# Patient Record
Sex: Female | Born: 2000 | Race: Black or African American | Hispanic: No | Marital: Single | Smoking: Never smoker
Health system: Southern US, Community
[De-identification: ages and names within clinical notes are randomized; demographics above are authoritative.]

## PROBLEM LIST (undated history)

## (undated) ENCOUNTER — Emergency Department (HOSPITAL_COMMUNITY): Payer: No Typology Code available for payment source

## (undated) DIAGNOSIS — R55 Syncope and collapse: Secondary | ICD-10-CM

## (undated) DIAGNOSIS — E559 Vitamin D deficiency, unspecified: Secondary | ICD-10-CM

## (undated) DIAGNOSIS — G43909 Migraine, unspecified, not intractable, without status migrainosus: Secondary | ICD-10-CM

## (undated) DIAGNOSIS — S83249A Other tear of medial meniscus, current injury, unspecified knee, initial encounter: Secondary | ICD-10-CM

## (undated) DIAGNOSIS — S83519A Sprain of anterior cruciate ligament of unspecified knee, initial encounter: Secondary | ICD-10-CM

## (undated) HISTORY — DX: Vitamin D deficiency, unspecified: E55.9

## (undated) HISTORY — DX: Migraine, unspecified, not intractable, without status migrainosus: G43.909

## (undated) HISTORY — DX: Syncope and collapse: R55

---
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
## 2020-01-01 ENCOUNTER — Telehealth: Payer: Self-pay | Admitting: Nurse Practitioner

## 2020-01-01 NOTE — Telephone Encounter (Signed)
Called to Discuss with patient about Covid symptoms and the use of bamlanivimab, a monoclonal antibody infusion for those with mild to moderate Covid symptoms and at a high risk of hospitalization.     Pt states that her only symptom is fatigue.

## 2020-01-05 ENCOUNTER — Ambulatory Visit: Payer: 59 | Admitting: Dietician

## 2020-02-12 ENCOUNTER — Other Ambulatory Visit: Payer: Self-pay

## 2020-02-12 ENCOUNTER — Encounter: Payer: Self-pay | Admitting: Dietician

## 2020-02-12 ENCOUNTER — Encounter: Payer: 59 | Attending: Internal Medicine | Admitting: Dietician

## 2020-02-12 DIAGNOSIS — R7303 Prediabetes: Secondary | ICD-10-CM | POA: Insufficient documentation

## 2020-02-12 NOTE — Patient Instructions (Addendum)
Stay active most every day (walk, you tube, dancing). Eat more frequently.  Breakfast, lunch, dinner. Whole grains most of the time. Plenty of Non starchy vegetables Avoid skipping meals.  Aim for Breakfast, Lunch, and Dinner. Listen to your body.  Stop eating when satisfied. Choose lean means, small portions.  Aim for 30-45 grams of carbohydrate per meal Small amounts of protein with meals.

## 2020-02-12 NOTE — Progress Notes (Signed)
Medical Nutrition Therapy:  Appt start time: 1600 end time:  1700.   Assessment:  Primary concerns today: . Patient is here today with her grandmother.  Her mother wants to be on the visit by phone but they are unable to reach her.  She is being seen today for prediabetes and weight. She does have a thyroid nodule. She reports no period despite being on consistent hormone pills for 3 months. Darkening on the skin of her neck and patient noted that this is on other parts of her ody as well.  She reports being diagnosed with PCOS in the past. She was last seen by a RD in 2016.  Her weight was 170 lbs at that time. She is taking Metformin XR  Weight today 218 lbs decreased from 223 lbs at her MD appointment in December.   She currently often only eats 1 meal per day and that is very late (9-10 pm after she gets off of work).  Cytogenetic analysis revealed  20 XX with a partial deletion of long arms of X chromosome. Further genetic work up pending.  PreferreMedications include Metformin XR "should take this more than I should".  Patient lives with her mom, dad, brother and sister who is now in college.  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  Her mom and dad share shopping and cooking.  Romie cooks occasionally.  She works at [employer] Sleeps 5-6 hours per night.  Learning Style:   No preference indicated   Learning Readiness:   Ready  Change in progress   DIETARY INTAKE:  24-hr recall:  B ( AM): skips  Snk ( AM): none L ( PM): skips often OR chicken nuggets or chicken sandwich Snk ( PM): none or salad with ham, chicken, cheese, and corn D (10-11  PM): fried fish and greens OR baked pork chop, brown rice, zucchini Snk ( PM): none Beverages: water, regular soda occasionally, occasional juice, occasional tea, 2% milk occasionally  Usual physical activity: Stands a lot at work and school.  Walks periodically.  Progress Towards Goal(s):  In progress.   Nutritional  Diagnosis:  NB-1.1 Food and nutrition-related knowledge deficit As related to balance of carbohydrates, protein, and fat.  As evidenced by diet hx and patient report.    Intervention:  Nutrition education/counseling regarding the importance of regularly scheduled meals, appropriate balance of carbohydrates, protein, and fat, mindfulness and the benefits of regular exercise.   Discussed that her metabolism may be slowed based on previous eating habits. Discussed that it is best to eat earlier in the day when possible. Stay active most every day (walk, you tube, dancing). Eat more frequently.  Breakfast, lunch, dinner. Whole grains most of the time. Plenty of Non starchy vegetables Avoid skipping meals.  Aim for Breakfast, Lunch, and Dinner. Listen to your body.  Stop eating when satisfied. Choose lean means, small portions.  Aim for 30-45 grams of carbohydrate per meal Small amounts of protein with meals.    Teaching Method Utilized:  Visual Auditory Hands on  Handouts given during visit include:  My plate  Meal plan card  Barriers to learning/adherence to lifestyle change: time, medical dx  Demonstrated degree of understanding via:  Teach Back   Monitoring/Evaluation:  Dietary intake, exercise, and body weight in 2 month(s).

## 2020-03-11 ENCOUNTER — Other Ambulatory Visit: Payer: Self-pay

## 2020-03-15 ENCOUNTER — Other Ambulatory Visit: Payer: Self-pay

## 2020-03-15 ENCOUNTER — Encounter: Payer: Self-pay | Admitting: Internal Medicine

## 2020-03-15 ENCOUNTER — Ambulatory Visit (INDEPENDENT_AMBULATORY_CARE_PROVIDER_SITE_OTHER): Payer: 59 | Admitting: Internal Medicine

## 2020-03-15 VITALS — BP 122/78 | HR 95 | Temp 98.2°F | Ht 61.0 in | Wt 214.0 lb

## 2020-03-15 DIAGNOSIS — R7303 Prediabetes: Secondary | ICD-10-CM | POA: Diagnosis not present

## 2020-03-15 DIAGNOSIS — Q991 46, XX true hermaphrodite: Secondary | ICD-10-CM

## 2020-03-15 DIAGNOSIS — E042 Nontoxic multinodular goiter: Secondary | ICD-10-CM | POA: Diagnosis not present

## 2020-03-15 LAB — LIPID PANEL
Cholesterol: 176 mg/dL (ref 0–200)
HDL: 48.8 mg/dL (ref >39.00)
LDL Cholesterol: 117 mg/dL — ABNORMAL HIGH (ref 0–99)
NonHDL: 127.14
Total CHOL/HDL Ratio: 4
Triglycerides: 51 mg/dL (ref 0.0–149.0)
VLDL: 10.2 mg/dL (ref 0.0–40.0)

## 2020-03-15 LAB — COMPREHENSIVE METABOLIC PANEL
ALT: 36 U/L — ABNORMAL HIGH (ref 0–35)
AST: 19 U/L (ref 0–37)
Albumin: 3.9 g/dL (ref 3.5–5.2)
Alkaline Phosphatase: 64 U/L (ref 47–119)
BUN: 9 mg/dL (ref 6–23)
CO2: 26 mEq/L (ref 19–32)
Calcium: 9 mg/dL (ref 8.4–10.5)
Chloride: 104 mEq/L (ref 96–112)
Creatinine, Ser: 0.73 mg/dL (ref 0.40–1.20)
GFR: 124.26 mL/min (ref >60.00)
Glucose, Bld: 106 mg/dL — ABNORMAL HIGH (ref 70–99)
Potassium: 4 mEq/L (ref 3.5–5.1)
Sodium: 139 mEq/L (ref 135–145)
Total Bilirubin: 0.2 mg/dL (ref 0.2–1.2)
Total Protein: 6.7 g/dL (ref 6.0–8.3)

## 2020-03-15 LAB — POCT GLYCOSYLATED HEMOGLOBIN (HGB A1C): Hemoglobin A1C: 5.6 % (ref 4.0–5.6)

## 2020-03-15 MED ORDER — METFORMIN HCL ER 500 MG PO TB24: 1000.0000 mg | ORAL_TABLET | Freq: Every day | ORAL | 3 refills | Status: DC

## 2020-03-15 MED ORDER — LEVONORGEST-ETH ESTRAD 91-DAY 0.1-0.02 & 0.01 MG PO TABS: 1.0000 | ORAL_TABLET | Freq: Every day | ORAL | 3 refills | Status: DC

## 2020-03-15 NOTE — Progress Notes (Signed)
Name: Jacqueline Olson  MRN/ DOB: [id number], 2001-03-29    Age/ Sex: 19 y.o., female     PCP: Dene Gentry, MD   Reason for Endocrinology Evaluation: Premature Ovarian Failure     Initial Endocrinology Clinic Visit: 11/17/2019    PATIENT IDENTIFIER: Jacqueline Olson is a 19 y.o., female with a past medical history of Prediabetes, obesity and premature ovarian failure  . She has followed with [facility] since 11/17/2019 for consultative assistance with management of her Premature ovarian failure.   HISTORICAL SUMMARY: The patient was first diagnosed with premature ovarian failure during evaluation of irregular menses in 04/2019 with an [place] of 58 mIU/mL and LH 34.3 mIU/mL  With an estradiol of 11.2 pg/mL. She was started on Combined OCP . 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   Per pt she had menarche at age 52. Her menstruations have been regular until the age of 40 when her periods have become irregular.  She was also started on metformin for pre-diabetes  Graduated from high school,currently in cosmetology school  Father with diabetes.  Maternal aunt with thyroid disease.    She was seen by a pediatric genetic counselor at [facility] in 02/2020. A chromosome microarray has been sent .   No Hx of premature ovarian failure in the family  SUBJECTIVE:    Today (03/15/2020):  Jacqueline Olson is here for a follow up on her diagnosis of premature ovarian failure. She is pending microsome microarray testing.   She has not been exercising occasionally. She has lost a few lbs since her last visit.   Denies any local neck symptoms.  She is not consistent with metformin intake.      ROS:  As per HPI.   HISTORY:  Past Medical History:  Past Medical History:  Diagnosis Date  . ACL tear 02/05/2016   right knee  . Medial meniscus tear 02/05/2016   right knee  . Migraines    Past Surgical History:  Past Surgical  History:  Procedure Laterality Date  . CENTRAL VENOUS CATHETER INSERTION  2001-09-14  . KNEE ARTHROSCOPY WITH ANTERIOR CRUCIATE LIGAMENT (ACL) REPAIR WITH HAMSTRING GRAFT Right 03/02/2016   Procedure: RIGHT KNEE ARTHROSCOPY,PARTIAL MEDIAL MENISECTOMY WITH ANTERIOR CRUCIATE LIGAMENT (ACL) REPAIR WITH HAMSTRING AUTOGRAFT;  Surgeon: Ninetta Lights, MD;  Location: [place];  Service: Orthopedics;  Laterality: Right;  . KNEE ARTHROSCOPY WITH LATERAL MENISECTOMY Right 03/02/2016   Procedure: KNEE ARTHROSCOPY WITH PARTIAL LATERAL MENISECTOMY;  Surgeon: Ninetta Lights, MD;  Location: [place];  Service: Orthopedics;  Laterality: Right;    Social History:  reports that she has never smoked. She has never used smokeless tobacco. She reports that she does not drink alcohol or use drugs. Family History:  Family History  Problem Relation Age of Onset  . Hypertension Mother   . Diabetes Father   . Hypertension Father   . Kidney disease Paternal Grandfather        ESRD/dialysis  . Hypertension Paternal Grandfather   . Diabetes Paternal Grandfather   . Stroke Paternal Grandfather      HOME MEDICATIONS: Allergies as of 03/15/2020   No Known Allergies     Medication List       Accurate as of March 15, 2020 10:20 AM. If you have any questions, ask your nurse or doctor.        ibuprofen 200 MG tablet Commonly known as:  ADVIL Take 800 mg by mouth every 6 (six) hours as needed for moderate pain.   Investigational vitamin D 600 UNITS capsule SWOG S0812 Take 600 Units by mouth daily. Take with food.   Levonorgestrel-Ethinyl Estradiol 0.1-0.02 & 0.01 MG tablet Commonly known as: AMETHIA Take 1 tablet by mouth daily.   metFORMIN 500 MG 24 hr tablet Commonly known as: GLUCOPHAGE-XR Take 1,000 mg by mouth at bedtime.         OBJECTIVE:   PHYSICAL EXAM: VS: BP 122/78 (BP Location: Left Arm, Patient Position: Sitting, Cuff Size: Large)   Pulse 95   Temp  98.2 F (36.8 C)   Ht 5\' 1"  (1.549 m)   Wt 214 lb (97.1 kg)   SpO2 95%   BMI 40.43 kg/m    EXAM: General: Pt appears well and is in NAD  Lungs: Clear with good BS bilat with no rales, rhonchi, or wheezes  Heart: Auscultation: RRR.  Abdomen: Normoactive bowel sounds, soft, nontender, without masses or organomegaly palpable  Extremities:  BL LE: No pretibial edema normal ROM and strength.  Neuro: Cranial nerves: II - XII grossly intact  Motor: Normal strength throughout DTRs: 2+ and symmetric in UE without delay in relaxation phase  Mental Status: Judgment, insight: Intact Orientation: Oriented to time, place, and person Mood and affect: No depression, anxiety, or agitation     DATA REVIEWED:  Results for Jacqueline, Olson (MRN Angelita Ingles) as of 03/15/2020 14:09  Ref. Range 03/15/2020 08:39  Sodium Latest Ref Range: 135 - 145 mEq/L 139  Potassium Latest Ref Range: 3.5 - 5.1 mEq/L 4.0  Chloride Latest Ref Range: 96 - 112 mEq/L 104  CO2 Latest Ref Range: 19 - 32 mEq/L 26  Glucose Latest Ref Range: 70 - 99 mg/dL 05/15/2020 (H)  BUN Latest Ref Range: 6 - 23 mg/dL 9  Creatinine Latest Ref Range: 0.40 - 1.20 mg/dL 235  Calcium Latest Ref Range: 8.4 - 10.5 mg/dL 9.0  Alkaline Phosphatase Latest Ref Range: 47 - 119 U/L 64  Albumin Latest Ref Range: 3.5 - 5.2 g/dL 3.9  AST Latest Ref Range: 0 - 37 U/L 19  ALT Latest Ref Range: 0 - 35 U/L 36 (H)  Total Protein Latest Ref Range: 6.0 - 8.3 g/dL 6.7  Total Bilirubin Latest Ref Range: 0.2 - 1.2 mg/dL 0.2  GFR Latest Ref Range: >60.00 mL/min 124.26  Total CHOL/HDL Ratio Unknown 4  Cholesterol Latest Ref Range: 0 - 200 mg/dL 5.73  HDL Cholesterol Latest Ref Range: >39.00 mg/dL 220  LDL (calc) Latest Ref Range: 0 - 99 mg/dL 25.42 (H)  NonHDL Unknown 127.14  Triglycerides Latest Ref Range: 0.0 - 149.0 mg/dL 706  VLDL Latest Ref Range: 0.0 - 40.0 mg/dL 23.7    ASSESSMENT / PLAN / RECOMMENDATIONS:   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 Deletions of the X long arm have correlated with premature menopause or gonadal dysgenesis.  - In review of the literature long arm deletion of chromosome X has been linked to variable degrees of intellectual disability , renal disorders and even seizure disorders. - She is tolerating COCP's without side effects  In the meantime I agree with continuing Levonorgestrel- ethinyl estradiol tablets daily for hormonal replacement.   Medications :  Continue COCP's- needs a refill today      2. Pre-Diabetes :   - I have praised the  pt on recent weight loss and improved glycemic control - I have encouraged her to work on lifestyle changes with exercise, avoiding sugar-sweetened beverages and eating fruits and vegetables.  - I have advised her to take Metformin 500 mg,  2 tabs in the morning to improve compliance ( instead of 2 in the evening )   3. Multinodular Goiter :  - She is clinically and biochemically euthyroid  - No local neck symptoms - She will be due for a repeat thyroid ultrasound ~ 11/2020   4. Elevated ALT:   - Minimal elevation of ALT. Fatty liver is in the differential. Will encourage weight loss.      F/U in 6 months   Signed electronically by: Lyndle Herrlich, MD  [facility] Endocrinology  [facility] [address] [place] [place], [place] [address] Phone: [phone] FAX: [phone]      CC: Maeola Harman, MD [address] [place] [address] Phone: [phone]  Fax: [phone]   Return to Endocrinology clinic as below: Future Appointments  Date Time Provider Department Center  03/26/2020  3:30 PM Bonnita Levan, RD [facility] NDM  09/20/2020  9:10 AM Elese Rane, Konrad Dolores, MD [facility] None

## 2020-03-15 NOTE — Patient Instructions (Signed)
-   Exercise ( brisk walk) 30 minutes 5 days a week - avoid sugar- sweetened beverages.  - If you have to snack choose vegetables and fresh fruits

## 2020-03-19 ENCOUNTER — Encounter: Payer: Self-pay | Admitting: Internal Medicine

## 2020-03-26 ENCOUNTER — Other Ambulatory Visit: Payer: Self-pay

## 2020-03-26 ENCOUNTER — Encounter: Payer: 59 | Attending: Internal Medicine | Admitting: Dietician

## 2020-03-26 ENCOUNTER — Encounter: Payer: Self-pay | Admitting: Dietician

## 2020-03-26 DIAGNOSIS — R7303 Prediabetes: Secondary | ICD-10-CM | POA: Diagnosis present

## 2020-03-26 DIAGNOSIS — Z6841 Body Mass Index (BMI) 40.0 and over, adult: Secondary | ICD-10-CM

## 2020-03-26 NOTE — Progress Notes (Signed)
  Medical Nutrition Therapy:  Appt start time: 1555 end time:  1615.   Assessment:  Primary concerns today: Patient is here today alone.   She states that she is walking more with work and school"C.  She is parking farther away. Trying to eat breakfast in the morning but is not hungry so finds this hard.  Based on her schedule and meal choices, overall diet is high in fat and low in vegetables. She is eating more regularly. She has however lost 6 lbs in the past 6 weeks. A1C decreased to 5.6% 03/15/20 Weight 212 lbs today decreased from 218 lbs 03/26/2020   She does have a thyroid nodule. She reports no period despite being on consistent hormone pills for 3 months. Darkening on the skin of her neck and patient noted that this is on other parts of her ody as well.  She reports being diagnosed with PCOS in the past. She was last seen by a RD in 2016.  Her weight was 170 lbs at that time. She is taking Metformin XR  Weight today 218 lbs decreased from 223 lbs at her MD appointment in December.   She currently often only eats 1 meal per day and that is very late (9-10 pm after she gets off of work).  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 Further genetic work up pending.  PreferreMedications include Metformin XR "should take this more than I should".  Patient lives with her mom, dad, brother and sister who is now in college.  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  Her mom and dad share shopping and cooking.  [place]aya cooks occasionally.  She works at [employer] Sleeps 5-6 hours per night.   Preferred Learning Style:   No preference indicated   Learning Readiness:   Ready  Change in progress   DIETARY INTAKE:  24-hr recall:  B ( AM): chicken biscuit  OR yogurt OR SKIPS  Snk ( AM): none  L ( PM): fast food (Chicken wrap at [employer] -occasional milkshake OR [employer] nuggets and fries or wrap and occasional  fries) Snk ( PM): chips when hungry D ( PM):  Take out- Rice, bean and spinach bowl, tortilla chips OR  Snk ( PM): none Beverages: water, regular lemonade, gatorade, powerade, sweet tea  Usual physical activity: walking more  Progress Towards Goal(s):  In progress.   Nutritional Diagnosis:  NB-1.1 Food and nutrition-related knowledge deficit As related to balance of carbohydrate, protien, and fat.  As evidenced by diet hx.    Intervention:  Nutrition education continued with focus on healthier food choices when eating out and tips to increase her vegetable intake.  Also discussed decreasing her fat intake.  Plan: Continue to stay active most every day (walk, you tube, dancing). Eat more frequently.  Breakfast, lunch, dinner. Whole grains most of the time. Plenty of Non starchy vegetables Listen to your body.  Stop eating when satisfied. Choose lean means, small portions. When eating out, consider the salad more often Choose baked rather than fried. Aim for 30-45 grams of carbohydrate per meal Small amounts of protein with meals.   Choose beverages without sugar/carbohydrates most of the time  Teaching Method Utilized:  Visual Auditory   Barriers to learning/adherence to lifestyle change: time  Demonstrated degree of understanding via:  Teach Back   Monitoring/Evaluation:  Dietary intake, exercise, label reading, and body weight in 2 month(s).

## 2020-03-26 NOTE — Patient Instructions (Addendum)
Continue to stay active most every day (walk, you tube, dancing). Eat more frequently.  Breakfast, lunch, dinner. Whole grains most of the time. Plenty of Non starchy vegetables Listen to your body.  Stop eating when satisfied. Choose lean means, small portions. When eating out, consider the salad more often Choose baked rather than fried.  Aim for 30-45 grams of carbohydrate per meal Small amounts of protein with meals.   Choose beverages without sugar/carbohydrates most of the time

## 2020-05-31 ENCOUNTER — Ambulatory Visit: Payer: 59 | Admitting: Dietician

## 2020-06-28 ENCOUNTER — Encounter: Payer: 59 | Attending: Internal Medicine | Admitting: Dietician

## 2020-06-28 ENCOUNTER — Other Ambulatory Visit: Payer: Self-pay

## 2020-06-28 DIAGNOSIS — Z6841 Body Mass Index (BMI) 40.0 and over, adult: Secondary | ICD-10-CM | POA: Insufficient documentation

## 2020-06-28 NOTE — Patient Instructions (Signed)
Plan: Continue to stay active most every day (walk, you tube, dancing). Eat more frequently. Breakfast, lunch, dinner. Whole grains most of the time. Plenty of Non starchy vegetables Listen to your body. Stop eating when satisfied. Choose lean means, small portions. When eating out, consider the salad more often Choose baked rather than fried. Aim for 30-45 grams of carbohydrate per meal Small amounts of protein with meals. Choose beverages without sugar/carbohydrates most of the time

## 2020-06-28 NOTE — Progress Notes (Signed)
Medical Nutrition Therapy:  Appt start time: 1200 end time:  1215.   Assessment:  Primary concerns today:  Patient is here today alone.  She was last seen 03/26/2020 .  States that she is trying to eat better. Trying to eat breakfast.  Sometimes only eats once per day. She exercises inconsistently and lacks motivation.  She continues to par farther away to increase her steps.  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  Further genetic workup is pending. Medications includes Metformin XR (inconsistent. Patient lives with her mom, dad, brother, and sister who is now in college.  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  Her mom and dad share shopping and cooking.  Meleena cooks occasionally.  She works at [employer] Sleeps 5-6 hours per night.  Weight hx: 208 lbs 06/28/2020 212 lbs 03/2020 218 lbs 223 lbs 11/2019 170 lbs 2016   Preferred Learning Style:   Auditory  Learning Readiness:   Ready  Change in progress    DIETARY INTAKE:  24-hr recall:  B ( AM): none  Snk ( AM):   L ( PM): chicken, black eyed peas, rice Snk ( PM):  D ( PM):  [employer] Chicken wrap and milk shake Snk ( PM):  Beverages: water, occasional regular soda and juice  Usual physical activity: parking farther away   Progress Towards Goal(s):  In progress.   Nutritional Diagnosis:  NB-1.1 Food and nutrition-related knowledge deficit As related to balance of carbohydrate, protein, and fat.  As evidenced by diet hx and patient report.    Intervention:  Nutrition education continued.  Encouraged her to stay active, improve consistency of her meals, look at meal quality and calorie density as well as choosing lower fat choices most often and beverages without carbohydrates most of the time.  Balance meals with carbs and protein.  Plan: Continue to stay active most every day (walk, you tube, dancing). Eat more frequently. Breakfast, lunch,  dinner. Whole grains most of the time. Plenty of Non starchy vegetables Listen to your body. Stop eating when satisfied. Choose lean means, small portions. When eating out, consider the salad more often Choose baked rather than fried. Aim for 30-45 grams of carbohydrate per meal Small amounts of protein with meals. Choose beverages without sugar/carbohydrates most of the time  Teaching Method Utilized:  Auditory  Barriers to learning/adherence to lifestyle change: time, motivation  Demonstrated degree of understanding via:  Teach Back   Monitoring/Evaluation:  Dietary intake, exercise, and body weight in 2 month(s).

## 2020-08-27 ENCOUNTER — Telehealth: Payer: Self-pay | Admitting: Internal Medicine

## 2020-08-27 NOTE — Telephone Encounter (Signed)
Attempted to call the number below twice and give them MD message. No answer was received. A fax was sent to the number listed below with MD message.

## 2020-08-27 NOTE — Telephone Encounter (Signed)
This is not a test that was done or ordered by you is it?

## 2020-08-27 NOTE — Telephone Encounter (Signed)
[place] with [facility]  ph# [phone] called re: Patient is scheduled for an appointment with [facility] on 09/03/20 and [place] stated they need a microarray report and a karyotype report before 09/03/20. Requests to fax the above reports to fax# [phone].

## 2020-09-01 ENCOUNTER — Encounter: Payer: Self-pay | Admitting: Internal Medicine

## 2020-09-20 ENCOUNTER — Other Ambulatory Visit: Payer: Self-pay

## 2020-09-20 ENCOUNTER — Ambulatory Visit (INDEPENDENT_AMBULATORY_CARE_PROVIDER_SITE_OTHER): Payer: 59 | Admitting: Internal Medicine

## 2020-09-20 VITALS — BP 120/76 | HR 96 | Wt 219.0 lb

## 2020-09-20 DIAGNOSIS — Q991 46, XX true hermaphrodite: Secondary | ICD-10-CM

## 2020-09-20 DIAGNOSIS — E042 Nontoxic multinodular goiter: Secondary | ICD-10-CM | POA: Diagnosis not present

## 2020-09-20 DIAGNOSIS — R7303 Prediabetes: Secondary | ICD-10-CM | POA: Diagnosis not present

## 2020-09-20 LAB — COMPREHENSIVE METABOLIC PANEL
ALT: 13 U/L (ref 0–35)
AST: 14 U/L (ref 0–37)
Albumin: 3.5 g/dL (ref 3.5–5.2)
Alkaline Phosphatase: 53 U/L (ref 47–119)
BUN: 13 mg/dL (ref 6–23)
CO2: 24 mEq/L (ref 19–32)
Calcium: 8.6 mg/dL (ref 8.4–10.5)
Chloride: 103 mEq/L (ref 96–112)
Creatinine, Ser: 0.73 mg/dL (ref 0.40–1.20)
GFR: 118.95 mL/min (ref >60.00)
Glucose, Bld: 189 mg/dL — ABNORMAL HIGH (ref 70–99)
Potassium: 3.8 mEq/L (ref 3.5–5.1)
Sodium: 137 mEq/L (ref 135–145)
Total Bilirubin: 0.2 mg/dL (ref 0.2–1.2)
Total Protein: 6.4 g/dL (ref 6.0–8.3)

## 2020-09-20 LAB — TSH: TSH: 1.68 u[IU]/mL (ref 0.40–5.00)

## 2020-09-20 LAB — HEMOGLOBIN A1C: Hgb A1c MFr Bld: 6.4 % (ref 4.6–6.5)

## 2020-09-20 NOTE — Patient Instructions (Signed)
-   Please check with [facility] about not having a period since April,2021    In the meantime continue birth control pills and 2 tablet of metformin daily

## 2020-09-20 NOTE — Progress Notes (Signed)
Name: Jacqueline Olson  MRN/ DOB: [id number], June 14, 2001    Age/ Sex: 19 y.o., female     PCP: Maeola Harman, MD   Reason for Endocrinology Evaluation: Premature Ovarian Failure     Initial Endocrinology Clinic Visit: 11/17/2019    PATIENT IDENTIFIER: Jacqueline Olson is a 19 y.o., female with a past medical history of Prediabetes, obesity and premature ovarian failure  . She has followed with [facility] since 11/17/2019 for consultative assistance with management of her Premature ovarian failure.   HISTORICAL SUMMARY: The patient was first diagnosed with premature ovarian failure during evaluation of irregular menses in 04/2019 with an FSH of 58 mIU/mL and LH 34.3 mIU/mL  With an estradiol of 11.2 pg/mL. She was started on Combined OCP . 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   Per pt she had menarche at age 53. Her menstruations have been regular until the age of 39 when her periods have become irregular.  She was also started on metformin for pre-diabetes  Graduated from high school,currently in cosmetology school  Father with diabetes.  Maternal aunt with thyroid disease.    She was seen by a pediatric genetic counselor at [facility] in 02/2020.   No Hx of premature ovarian failure in the family  SUBJECTIVE:    Today (09/20/2020):  Jacqueline Olson is here for a follow up on her diagnosis of premature ovarian failure, prediabetes and MNG   She is pending microsome microarray testing.   He weight continues to fluctuate   Denies any local neck symptoms.  She is not consistent with metformin intake.     HISTORY:  Past Medical History:  Past Medical History:  Diagnosis Date  . ACL tear 02/05/2016   right knee  . Medial meniscus tear 02/05/2016   right knee  . Migraines    Past Surgical History:  Past Surgical History:  Procedure Laterality Date  . CENTRAL VENOUS CATHETER INSERTION  Jan 09, 2001  . KNEE  ARTHROSCOPY WITH ANTERIOR CRUCIATE LIGAMENT (ACL) REPAIR WITH HAMSTRING GRAFT Right 03/02/2016   Procedure: RIGHT KNEE ARTHROSCOPY,PARTIAL MEDIAL MENISECTOMY WITH ANTERIOR CRUCIATE LIGAMENT (ACL) REPAIR WITH HAMSTRING AUTOGRAFT;  Surgeon: Loreta Ave, MD;  Location: [facility];  Service: Orthopedics;  Laterality: Right;  . KNEE ARTHROSCOPY WITH LATERAL MENISECTOMY Right 03/02/2016   Procedure: KNEE ARTHROSCOPY WITH PARTIAL LATERAL MENISECTOMY;  Surgeon: Loreta Ave, MD;  Location: [facility];  Service: Orthopedics;  Laterality: Right;    Social History:  reports that she has never smoked. She has never used smokeless tobacco. She reports that she does not drink alcohol and does not use drugs. Family History:  Family History  Problem Relation Age of Onset  . Hypertension Mother   . Diabetes Father   . Hypertension Father   . Kidney disease Paternal Grandfather        ESRD/dialysis  . Hypertension Paternal Grandfather   . Diabetes Paternal Grandfather   . Stroke Paternal Grandfather      HOME MEDICATIONS: Allergies as of 09/20/2020   No Known Allergies     Medication List       Accurate as of September 20, 2020  2:19 PM. If you have any questions, ask your nurse or doctor.        ibuprofen 200 MG tablet Commonly known as: ADVIL Take 800 mg by mouth every 6 (six) hours as needed for moderate pain.   Investigational  vitamin D 600 UNITS capsule SWOG S0812 Take 600 Units by mouth daily. Take with food.   Levonorgestrel-Ethinyl Estradiol 0.1-0.02 & 0.01 MG tablet Commonly known as: AMETHIA Take 1 tablet by mouth daily.   metFORMIN 500 MG 24 hr tablet Commonly known as: GLUCOPHAGE-XR Take 2 tablets (1,000 mg total) by mouth daily with breakfast.         OBJECTIVE:   PHYSICAL EXAM: VS: BP 120/76 (BP Location: Right Arm, Patient Position: Sitting, Cuff Size: Normal)   Pulse 96   Wt 219 lb (99.3 kg)   SpO2 99%   BMI 41.38 kg/m     EXAM: General: Pt appears well and is in NAD  Lungs: Clear with good BS bilat with no rales, rhonchi, or wheezes  Heart: Auscultation: RRR.  Abdomen: Normoactive bowel sounds, soft, nontender, without masses or organomegaly palpable  Extremities:  BL LE: No pretibial edema normal ROM and strength.  Mental Status: Judgment, insight: Intact Orientation: Oriented to time, place, and person Mood and affect: No depression, anxiety, or agitation     DATA REVIEWED:  Results for MELISA, DONOFRIO (MRN [id number]) as of 09/20/2020 14:19  Ref. Range 09/20/2020 09:30  Sodium Latest Ref Range: 135 - 145 mEq/L 137  Potassium Latest Ref Range: 3.5 - 5.1 mEq/L 3.8  Chloride Latest Ref Range: 96 - 112 mEq/L 103  CO2 Latest Ref Range: 19 - 32 mEq/L 24  Glucose Latest Ref Range: 70 - 99 mg/dL 096 (H)  BUN Latest Ref Range: 6 - 23 mg/dL 13  Creatinine Latest Ref Range: 0.40 - 1.20 mg/dL 2.83  Calcium Latest Ref Range: 8.4 - 10.5 mg/dL 8.6  Alkaline Phosphatase Latest Ref Range: 47 - 119 U/L 53  Albumin Latest Ref Range: 3.5 - 5.2 g/dL 3.5  AST Latest Ref Range: 0 - 37 U/L 14  ALT Latest Ref Range: 0 - 35 U/L 13  Total Protein Latest Ref Range: 6.0 - 8.3 g/dL 6.4  Total Bilirubin Latest Ref Range: 0.2 - 1.2 mg/dL 0.2  GFR Latest Ref Range: >60.00 mL/min 118.95  Hemoglobin A1C Latest Ref Range: 4.6 - 6.5 % 6.4  TSH Latest Ref Range: 0.40 - 5.00 uIU/mL 1.68     ASSESSMENT / PLAN / RECOMMENDATIONS:   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 Deletions of the X long arm have correlated with premature menopause or gonadal dysgenesis.  - In review of the literature long arm deletion of chromosome X has been linked to variable degrees of intellectual disability , renal disorders and even seizure disorders. - She is tolerating COC's without side effects  In the meantime I agree with  continuing Levonorgestrel- ethinyl estradiol tablets daily for hormonal replacement.  -She was seen by Genetics specialist through  [facility], it is unclear to me if chromosome microarray has been sent , or what was the outcome of it. -Patient will follow up with gynecology as she has not had any breakthrough bleed since April 2021   Medications :  Continue COC's      2. Pre-Diabetes :   -She continues with imperfect adherence of Metformin. -I have emphasized the importance of lifestyle changes with weight loss and exercise to prevent diabetes. -Repeat A1c today is higher at 6.4% - I have again advised her to take Metformin 500 mg,  2 tabs in the morning to improve compliance ( instead of 2  in the evening )    Medication  Metformin 500 mg , 2 tabs daily     3. Multinodular Goiter :  - She is clinically and biochemically euthyroid  - No local neck symptoms -An order for repeat ultrasound has been placed      F/U in 6 months   Signed electronically by: Lyndle Herrlich, MD  [facility] Endocrinology  [facility] [address] [place] [place], [place] [address] Phone: [phone] FAX: [phone]      CC: Maeola Harman, MD [address] [place] [address] Phone: [phone]  Fax: [phone]   Return to Endocrinology clinic as below: Future Appointments  Date Time Provider Department Center  09/27/2020 10:30 AM Bonnita Levan, RD [facility] NDM  03/21/2021  9:30 AM Sabreen Kitchen, Konrad Dolores, MD [facility] None

## 2020-09-27 ENCOUNTER — Ambulatory Visit: Payer: 59 | Admitting: Dietician

## 2020-09-28 ENCOUNTER — Ambulatory Visit
Admission: RE | Admit: 2020-09-28 | Discharge: 2020-09-28 | Disposition: A | Discharge status: Home / Self Care | Payer: Self-pay | Source: Ambulatory Visit | Attending: Internal Medicine | Admitting: Internal Medicine

## 2020-09-28 DIAGNOSIS — E042 Nontoxic multinodular goiter: Secondary | ICD-10-CM

## 2020-10-04 ENCOUNTER — Encounter: Payer: Medicaid Other | Attending: Internal Medicine | Admitting: Dietician

## 2021-03-15 ENCOUNTER — Other Ambulatory Visit: Payer: Self-pay | Admitting: Internal Medicine

## 2021-03-21 ENCOUNTER — Ambulatory Visit (INDEPENDENT_AMBULATORY_CARE_PROVIDER_SITE_OTHER): Payer: Medicaid Other | Admitting: Internal Medicine

## 2021-03-21 ENCOUNTER — Encounter: Payer: Self-pay | Admitting: Internal Medicine

## 2021-03-21 ENCOUNTER — Other Ambulatory Visit: Payer: Self-pay

## 2021-03-21 VITALS — BP 126/80 | HR 91 | Ht 61.0 in | Wt 223.5 lb

## 2021-03-21 DIAGNOSIS — E042 Nontoxic multinodular goiter: Secondary | ICD-10-CM | POA: Diagnosis not present

## 2021-03-21 DIAGNOSIS — R7303 Prediabetes: Secondary | ICD-10-CM | POA: Diagnosis not present

## 2021-03-21 DIAGNOSIS — Q991 46, XX true hermaphrodite: Secondary | ICD-10-CM | POA: Diagnosis not present

## 2021-03-21 LAB — T4, FREE: Free T4: 0.84 ng/dL (ref 0.60–1.60)

## 2021-03-21 LAB — COMPREHENSIVE METABOLIC PANEL
ALT: 9 U/L (ref 0–35)
AST: 12 U/L (ref 0–37)
Albumin: 3.8 g/dL (ref 3.5–5.2)
Alkaline Phosphatase: 65 U/L (ref 39–117)
BUN: 10 mg/dL (ref 6–23)
CO2: 29 mEq/L (ref 19–32)
Calcium: 9.3 mg/dL (ref 8.4–10.5)
Chloride: 103 mEq/L (ref 96–112)
Creatinine, Ser: 0.75 mg/dL (ref 0.40–1.20)
GFR: 114.93 mL/min (ref >60.00)
Glucose, Bld: 99 mg/dL (ref 70–99)
Potassium: 4.3 mEq/L (ref 3.5–5.1)
Sodium: 140 mEq/L (ref 135–145)
Total Bilirubin: 0.2 mg/dL (ref 0.2–1.2)
Total Protein: 7 g/dL (ref 6.0–8.3)

## 2021-03-21 LAB — LIPID PANEL
Cholesterol: 187 mg/dL (ref 0–200)
HDL: 55.8 mg/dL (ref >39.00)
LDL Cholesterol: 122 mg/dL — ABNORMAL HIGH (ref 0–99)
NonHDL: 131.41
Total CHOL/HDL Ratio: 3
Triglycerides: 48 mg/dL (ref 0.0–149.0)
VLDL: 9.6 mg/dL (ref 0.0–40.0)

## 2021-03-21 LAB — HEMOGLOBIN A1C: Hgb A1c MFr Bld: 6.2 % (ref 4.6–6.5)

## 2021-03-21 LAB — TSH: TSH: 0.93 u[IU]/mL (ref 0.35–5.50)

## 2021-03-21 NOTE — Progress Notes (Signed)
Name: Jacqueline Olson  MRN/ DOB: [id number], 10-Feb-2001    Age/ Sex: 20 y.o., female     PCP: Maeola Harman, MD   Reason for Endocrinology Evaluation: Premature Ovarian Failure     Initial Endocrinology Clinic Visit: 11/17/2019    PATIENT IDENTIFIER: Jacqueline Olson is a 20 y.o., female with a past medical history of Prediabetes, obesity and premature ovarian failure  . She has followed with [facility] since 11/17/2019 for consultative assistance with management of her Premature ovarian failure.   HISTORICAL SUMMARY: The patient was first diagnosed with premature ovarian failure during evaluation of irregular menses in 04/2019 with an FSH of 58 mIU/mL and LH 34.3 mIU/mL  With an estradiol of 11.2 pg/mL. She was started on Combined OCP . 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   Per pt she had menarche at age 56. Her menstruations have been regular until the age of 80 when her periods have become irregular.  She was also started on metformin for pre-diabetes  Graduated from high school,currently in cosmetology school  Father with diabetes.  Maternal aunt with thyroid disease.    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 No evidence of Turner syndrome and it was determined no cardiology referral is needed and this deletion only causes premature ovarian failure .  No Hx of premature ovarian failure in the family  SUBJECTIVE:    Today (03/21/2021):  Jacqueline Olson is here for a follow up on her diagnosis of premature ovarian failure, prediabetes and MNG   Denies nausea or vomiting  Denies any local neck symptoms.  Denies hot flashes  She is not exercising     COC's Metformin 500 mg 2 tabs daily   HISTORY:  Past Medical History:  Past Medical History:  Diagnosis Date  . ACL tear 02/05/2016   right knee  . Medial meniscus tear  02/05/2016   right knee  . Migraines    Past Surgical History:  Past Surgical History:  Procedure Laterality Date  . CENTRAL VENOUS CATHETER INSERTION  2000/12/24  . KNEE ARTHROSCOPY WITH ANTERIOR CRUCIATE LIGAMENT (ACL) REPAIR WITH HAMSTRING GRAFT Right 03/02/2016   Procedure: RIGHT KNEE ARTHROSCOPY,PARTIAL MEDIAL MENISECTOMY WITH ANTERIOR CRUCIATE LIGAMENT (ACL) REPAIR WITH HAMSTRING AUTOGRAFT;  Surgeon: Loreta Ave, MD;  Location: [facility];  Service: Orthopedics;  Laterality: Right;  . KNEE ARTHROSCOPY WITH LATERAL MENISECTOMY Right 03/02/2016   Procedure: KNEE ARTHROSCOPY WITH PARTIAL LATERAL MENISECTOMY;  Surgeon: Loreta Ave, MD;  Location: [facility];  Service: Orthopedics;  Laterality: Right;    Social History:  reports that she has never smoked. She has never used smokeless tobacco. She reports that she does not drink alcohol and does not use drugs. Family History:  Family History  Problem Relation Age of Onset  . Hypertension Mother   . Diabetes Father   . Hypertension Father   . Kidney disease Paternal Grandfather        ESRD/dialysis  . Hypertension Paternal Grandfather   . Diabetes Paternal Grandfather   . Stroke Paternal Grandfather      HOME MEDICATIONS: Allergies as of 03/21/2021   No Known Allergies     Medication List       Accurate as of March 21, 2021  7:27 AM. If you have any questions, ask your nurse or doctor.  ibuprofen 200 MG tablet Commonly known as: ADVIL Take 800 mg by mouth every 6 (six) hours as needed for moderate pain.   Investigational vitamin D 600 UNITS capsule SWOG S0812 Take 600 Units by mouth daily. Take with food.   Levonorgestrel-Ethinyl Estradiol 0.1-0.02 & 0.01 MG tablet Commonly known as: AMETHIA TAKE 1 TABLET BY MOUTH EVERY DAY   metFORMIN 500 MG 24 hr tablet Commonly known as: GLUCOPHAGE-XR Take 2 tablets (1,000 mg total) by mouth daily with breakfast.         OBJECTIVE:    PHYSICAL EXAM: VS: There were no vitals taken for this visit.   EXAM: General: Pt appears well and is in NAD  Lungs: Clear with good BS bilat with no rales, rhonchi, or wheezes  Heart: Auscultation: RRR.  Abdomen: Normoactive bowel sounds, soft, nontender, without masses or organomegaly palpable  Extremities:  BL LE: No pretibial edema normal ROM and strength.  Mental Status: Judgment, insight: Intact Orientation: Oriented to time, place, and person Mood and affect: No depression, anxiety, or agitation     DATA REVIEWED:  Results for Jacqueline Olson, Jacqueline Olson (MRN [id number]) as of 03/22/2021 08:21  Ref. Range 03/21/2021 10:14  Sodium Latest Ref Range: 135 - 145 mEq/L 140  Potassium Latest Ref Range: 3.5 - 5.1 mEq/L 4.3  Chloride Latest Ref Range: 96 - 112 mEq/L 103  CO2 Latest Ref Range: 19 - 32 mEq/L 29  Glucose Latest Ref Range: 70 - 99 mg/dL 99  BUN Latest Ref Range: 6 - 23 mg/dL 10  Creatinine Latest Ref Range: 0.40 - 1.20 mg/dL 3.32  Calcium Latest Ref Range: 8.4 - 10.5 mg/dL 9.3  Alkaline Phosphatase Latest Ref Range: 39 - 117 U/L 65  Albumin Latest Ref Range: 3.5 - 5.2 g/dL 3.8  AST Latest Ref Range: 0 - 37 U/L 12  ALT Latest Ref Range: 0 - 35 U/L 9  Total Protein Latest Ref Range: 6.0 - 8.3 g/dL 7.0  Total Bilirubin Latest Ref Range: 0.2 - 1.2 mg/dL 0.2  GFR Latest Ref Range: >60.00 mL/min 114.93  Total CHOL/HDL Ratio Unknown 3  Cholesterol Latest Ref Range: 0 - 200 mg/dL 951  HDL Cholesterol Latest Ref Range: >39.00 mg/dL 88.41  LDL (calc) Latest Ref Range: 0 - 99 mg/dL 660 (H)  NonHDL Unknown 131.41  Triglycerides Latest Ref Range: 0.0 - 149.0 mg/dL 63.0  VLDL Latest Ref Range: 0.0 - 40.0 mg/dL 9.6  Hemoglobin Z6W Latest Ref Range: 4.6 - 6.5 % 6.2  TSH Latest Ref Range: 0.35 - 5.50 uIU/mL 0.93  T4,Free(Direct) Latest Ref Range: 0.60 - 1.60 ng/dL 1.09    ASSESSMENT / PLAN / RECOMMENDATIONS:   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 Deletions of the X long arm have correlated with premature menopause or gonadal dysgenesis.  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 No evidence of Turner syndrome and it was determined no cardiology referral is needed and this deletion only causes premature ovarian failure .  - She is tolerating COC's without side effects   -Patient to schedule an appointment with Gyn as she has not had  menses since last year    Medications :  Continue COC's      2. Pre-Diabetes :   -She encouraged again to start lifestyle changes with low carb diet, and exercise, she continues to gain weight   -Repeat A1c today is higher at 6.2% - Lipid panel stable    Medication  Metformin 500 mg , 2 tabs daily     3. Multinodular Goiter :  - She is clinically and biochemically euthyroid  - No local neck symptoms - Repeat ultrasound 09/2020 did not show any evidence of thyroid nodules       F/U in 6 months   Signed electronically by: Lyndle Herrlich, MD  [facility] Endocrinology  [facility] [address] [place], [place] [address] Phone: [phone] FAX: [phone]      CC: Maeola Harman, MD [address] [place] [address] Phone: [phone]  Fax: [phone]   Return to Endocrinology clinic as below: Future Appointments  Date Time Provider Department Center  03/21/2021  9:30 AM Bayley Hurn, Konrad Dolores, MD [id number] None

## 2021-04-19 ENCOUNTER — Other Ambulatory Visit: Payer: Self-pay | Admitting: Internal Medicine

## 2021-05-11 ENCOUNTER — Telehealth: Payer: Self-pay | Admitting: Hematology and Oncology

## 2021-05-11 NOTE — Telephone Encounter (Signed)
Received a new hem referral from Valora Piccolo, [place] for thrombocytosis. Jacqueline Olson returned my cal and has been scheduled to see Dr. Al Pimple on 6/2 at 1040am. Pt aware to arrive 20 minutes early.

## 2021-05-12 ENCOUNTER — Encounter: Payer: Self-pay | Admitting: Hematology and Oncology

## 2021-05-12 ENCOUNTER — Inpatient Hospital Stay: Payer: No Typology Code available for payment source

## 2021-05-12 ENCOUNTER — Inpatient Hospital Stay
Payer: No Typology Code available for payment source | Attending: Hematology and Oncology | Admitting: Hematology and Oncology

## 2021-05-12 ENCOUNTER — Other Ambulatory Visit: Payer: Self-pay

## 2021-05-12 VITALS — BP 137/75 | HR 85 | Temp 97.9°F | Resp 17 | Ht 61.0 in | Wt 228.8 lb

## 2021-05-12 DIAGNOSIS — D75839 Thrombocytosis, unspecified: Secondary | ICD-10-CM

## 2021-05-12 LAB — COMPREHENSIVE METABOLIC PANEL
ALT: 7 U/L (ref 0–44)
AST: 9 U/L — ABNORMAL LOW (ref 15–41)
Albumin: 3.4 g/dL — ABNORMAL LOW (ref 3.5–5.0)
Alkaline Phosphatase: 67 U/L (ref 38–126)
Anion gap: 11 (ref 5–15)
BUN: 10 mg/dL (ref 6–20)
CO2: 24 mmol/L (ref 22–32)
Calcium: 9.1 mg/dL (ref 8.9–10.3)
Chloride: 106 mmol/L (ref 98–111)
Creatinine, Ser: 0.72 mg/dL (ref 0.44–1.00)
GFR, Estimated: >60 mL/min (ref >60)
Glucose, Bld: 89 mg/dL (ref 70–99)
Potassium: 4.2 mmol/L (ref 3.5–5.1)
Sodium: 141 mmol/L (ref 135–145)
Total Bilirubin: 0.3 mg/dL (ref 0.3–1.2)
Total Protein: 6.9 g/dL (ref 6.5–8.1)

## 2021-05-12 LAB — CBC WITH DIFFERENTIAL/PLATELET
Abs Immature Granulocytes: 0.02 10*3/uL (ref 0.00–0.07)
Basophils Absolute: 0 10*3/uL (ref 0.0–0.1)
Basophils Relative: 1 %
Eosinophils Absolute: 0 10*3/uL (ref 0.0–0.5)
Eosinophils Relative: 0 %
HCT: 35.8 % — ABNORMAL LOW (ref 36.0–46.0)
Hemoglobin: 12.1 g/dL (ref 12.0–15.0)
Immature Granulocytes: 0 %
Lymphocytes Relative: 20 %
Lymphs Abs: 1.4 10*3/uL (ref 0.7–4.0)
MCH: 28.3 pg (ref 26.0–34.0)
MCHC: 33.8 g/dL (ref 30.0–36.0)
MCV: 83.6 fL (ref 80.0–100.0)
Monocytes Absolute: 0.4 10*3/uL (ref 0.1–1.0)
Monocytes Relative: 5 %
Neutro Abs: 5.3 10*3/uL (ref 1.7–7.7)
Neutrophils Relative %: 74 %
Platelets: 462 10*3/uL — ABNORMAL HIGH (ref 150–400)
RBC: 4.28 MIL/uL (ref 3.87–5.11)
RDW: 13.5 % (ref 11.5–15.5)
WBC: 7.2 10*3/uL (ref 4.0–10.5)
nRBC: 0 % (ref 0.0–0.2)

## 2021-05-12 LAB — C-REACTIVE PROTEIN: CRP: 3.2 mg/dL — ABNORMAL HIGH (ref <1.0)

## 2021-05-12 NOTE — Progress Notes (Signed)
[facility] CONSULT NOTE  Patient Care Team: Maeola Harman, MD as PCP - General (Pediatrics)  CHIEF COMPLAINTS/PURPOSE OF CONSULTATION:  Thrombocytosis  ASSESSMENT & PLAN:  This is a pleasant 20 year old female patient with past medical history significant for prediabetes, premature ovarian failure, obesity referred to hematology for evaluation of persistent thrombocytosis.  She denied any complaints pertinent to the thrombocytosis such as known iron deficiency anemia, chronic pain, infections.  Physical examination, obesity, otherwise unremarkable.  I could not feel a definite of splenomegaly but hard to exclude mild splenomegaly with her body habitus. I have reviewed labs today with her.  No evidence of iron deficiency anemia, mild but persistent thrombocytosis noted without any evidence of basophilia or leukocytosis.  We have discussed the following details about primary and secondary thrombocytosis  Differential diagnosis  1. Primary thrombocytosis: Related to myeloproliferative disorders of the bone marrow especially essential thrombocytosis and CML. I would like to send for BCR-ABL as well as JAK-2 mutation analysis. Patient understands that JAK2 mutation is only present in 50% of essential thrombocytosis so the test is advantageous only if it is positive. If it is negative, it does not rule out. 2. Secondary/reactive thrombocytosis Different causes including infections, inflammation, iron deficiency.   Treatment options: 1. If it is primary essential thrombocytosis, treatment would depend on platelet count level as well as history of thrombosis. A. For low risk patients, the treatment would be with aspirin therapy B. for high risk patients, the treatment would be platelet lowering therapy with aspirin 2. Treatment of secondary thrombocytosis would be to treat underlying cause. There would not be any risk of thrombosis with secondary thrombocytosis.  We have discussed  about MPN testing today, mom had questions about insurance auth, I explained to her that I wouldn't know about this and they are welcome to do the labs later after talking to the insurance but they elected to proceed.  Return to clinic in 3 weeks to discuss the results of these tests and to discuss any additional recommendations.  HISTORY OF PRESENTING ILLNESS:  Jacqueline Olson 20 y.o. female is here because of thrombocytosis.  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  Patient tells me that this was first noted about a year ago and since it was persistent she was recommended to see hematology.  She arrived today to the appointment by herself, her mom was present on the phone.  According to patient, she denies any new health complaints.  She is amenorrheic and this was thought to be related to her premature ovarian failure.  She currently continues on birth control.  Besides menstrual issues, she also has vitamin D deficiency and she is yet to start her vitamin D supplementation.  She is also prediabetic with hemoglobin A1c of 6.2 and hence on metformin.  No erythromelalgia, intractable pruritus, B symptoms or other history of DVT/PE.  Rest of the pertinent 10 point ROS reviewed and negative.  REVIEW OF SYSTEMS:   Constitutional: Denies fevers, chills or abnormal night sweats Eyes: Denies blurriness of vision, double vision or watery eyes Ears, nose, mouth, throat, and face: Denies mucositis or sore throat Respiratory: Denies cough, dyspnea or wheezes Cardiovascular: Denies palpitation, chest discomfort or lower extremity swelling Gastrointestinal:  Denies nausea, heartburn or change in bowel habits Skin: Denies abnormal skin rashes Lymphatics: Denies new lymphadenopathy or easy bruising Neurological:Denies  numbness, tingling or new weaknesses Behavioral/Psych: Mood is stable, no new changes  All other systems were reviewed with the patient and are negative.  MEDICAL HISTORY:  Past Medical History:  Diagnosis Date  . ACL tear 02/05/2016   right knee  . Medial meniscus tear 02/05/2016   right knee  . Migraines     SURGICAL HISTORY: Past Surgical History:  Procedure Laterality Date  . CENTRAL VENOUS CATHETER INSERTION  22-Jul-2001  . KNEE ARTHROSCOPY WITH ANTERIOR CRUCIATE LIGAMENT (ACL) REPAIR WITH HAMSTRING GRAFT Right 03/02/2016   Procedure: RIGHT KNEE ARTHROSCOPY,PARTIAL MEDIAL MENISECTOMY WITH ANTERIOR CRUCIATE LIGAMENT (ACL) REPAIR WITH HAMSTRING AUTOGRAFT;  Surgeon: Loreta Ave, MD;  Location: [facility];  Service: Orthopedics;  Laterality: Right;  . KNEE ARTHROSCOPY WITH LATERAL MENISECTOMY Right 03/02/2016   Procedure: KNEE ARTHROSCOPY WITH PARTIAL LATERAL MENISECTOMY;  Surgeon: Loreta Ave, MD;  Location: [facility];  Service: Orthopedics;  Laterality: Right;    SOCIAL HISTORY: Social History   Socioeconomic History  . Marital status: Single    Spouse name: Not on file  . Number of children: Not on file  . Years of education: Not on file  . Highest education level: Not on file  Occupational History  . Not on file  Tobacco Use  . Smoking status: Never Smoker  . Smokeless tobacco: Never Used  Substance and Sexual Activity  . Alcohol use: No  . Drug use: No  . Sexual activity: Not on file  Other Topics Concern  . Not on file  Social History Narrative  . Not on file   Social Determinants of Health   Financial Resource Strain: Not on file  Food Insecurity: Not on file  Transportation Needs: Not on file  Physical Activity: Not on file  Stress: Not on file  Social Connections: Not on file  Intimate Partner Violence: Not At Risk  . Fear of Current or Ex-Partner: No  . Emotionally Abused: No  . Physically Abused: No  . Sexually  Abused: No    FAMILY HISTORY: Family History  Problem Relation Age of Onset  . Hypertension Mother   . Diabetes Father   . Hypertension Father   . Kidney disease Paternal Grandfather        ESRD/dialysis  . Hypertension Paternal Grandfather   . Diabetes Paternal Grandfather   . Stroke Paternal Grandfather     ALLERGIES:  has No Known Allergies.  MEDICATIONS:  Current Outpatient Medications  Medication Sig Dispense Refill  . cetirizine (ZYRTEC) 10 MG tablet Take 10 mg by mouth daily.    Marland Kitchen ibuprofen (ADVIL) 200 MG tablet Take 800 mg by mouth every 6 (six) hours as needed for moderate pain.    . Investigational vitamin D 600 UNITS capsule SWOG [id number] Take 600 Units by mouth daily. Take with food. (Patient not taking: No sig reported)    . Levonorgestrel-Ethinyl Estradiol (AMETHIA) 0.1-0.02 & 0.01 MG tablet TAKE 1 TABLET BY MOUTH EVERY DAY 91 tablet 0  . metFORMIN (GLUCOPHAGE-XR) 500 MG 24 hr tablet TAKE 2 TABLETS(1000 MG) BY MOUTH DAILY WITH BREAKFAST 180 tablet 3  . Vitamin D, Ergocalciferol, (DRISDOL) 1.25 MG (50000 UNIT) CAPS capsule Take 1 capsule by mouth once a week.     No current facility-administered medications for this visit.    PHYSICAL EXAMINATION: ECOG PERFORMANCE STATUS: 0 - Asymptomatic  Vitals:   05/12/21 1058  BP: 137/75  Pulse: 85  Resp: 17  Temp: 97.9 F (36.6 C)  SpO2: 99%  Filed Weights   05/12/21 1058  Weight: 228 lb 12.8 oz (103.8 kg)    GENERAL:alert, no distress and comfortable, obese SKIN: skin color, texture, turgor are normal, no rashes or significant lesions EYES: normal, conjunctiva are pink and non-injected, sclera clear OROPHARYNX:no exudate, no erythema and lips, buccal mucosa, and tongue normal  NECK: supple, thyroid normal size, non-tender, without nodularity LYMPH:  no palpable lymphadenopathy in the cervical, axillary  LUNGS: clear to auscultation and percussion with normal breathing effort HEART: regular rate & rhythm and no  murmurs and no lower extremity edema ABDOMEN:abdomen soft, non-tender and normal bowel sounds Musculoskeletal:no cyanosis of digits and no clubbing  PSYCH: alert & oriented x 3 with fluent speech NEURO: no focal motor/sensory deficits  LABORATORY DATA:  I have reviewed the data as listed Lab Results  Component Value Date   WBC 8.0 05/16/2019   HGB 12.1 05/16/2019   HCT 37.1 05/16/2019   MCV 82.4 05/16/2019   PLT 440 (H) 05/16/2019     Chemistry      Component Value Date/Time   NA 140 03/21/2021 1014   K 4.3 03/21/2021 1014   CL 103 03/21/2021 1014   CO2 29 03/21/2021 1014   BUN 10 03/21/2021 1014   CREATININE 0.75 03/21/2021 1014   CREATININE 0.63 12/13/2017 1538      Component Value Date/Time   CALCIUM 9.3 03/21/2021 1014   ALKPHOS 65 03/21/2021 1014   AST 12 03/21/2021 1014   ALT 9 03/21/2021 1014   BILITOT 0.2 03/21/2021 1014       RADIOGRAPHIC STUDIES: I have personally reviewed the radiological images as listed and agreed with the findings in the report. No results found.  All questions were answered. The patient knows to call the clinic with any problems, questions or concerns. I spent 45 minutes in the care of this patient including H and P, review of records, counseling and coordination of care.  We have discussed about types of thrombocytosis, pathogenesis of essential thrombocytosis, causes of secondary thrombocytosis and work-up needed and lab recommendations for today and follow-up.     Rachel Moulds, MD 05/12/2021 11:07 AM

## 2021-05-18 LAB — ANTINUCLEAR ANTIBODIES, IFA: ANA Ab, IFA: NEGATIVE

## 2021-05-19 LAB — JAK2 (INCLUDING V617F AND EXON 12), MPL,& CALR W/RFL MPN PANEL (NGS)

## 2021-06-02 ENCOUNTER — Inpatient Hospital Stay (HOSPITAL_BASED_OUTPATIENT_CLINIC_OR_DEPARTMENT_OTHER): Payer: No Typology Code available for payment source | Admitting: Hematology and Oncology

## 2021-06-02 ENCOUNTER — Telehealth: Payer: Self-pay | Admitting: Hematology and Oncology

## 2021-06-02 ENCOUNTER — Other Ambulatory Visit: Payer: Self-pay

## 2021-06-02 ENCOUNTER — Encounter: Payer: Self-pay | Admitting: Hematology and Oncology

## 2021-06-02 VITALS — BP 109/76 | HR 95 | Temp 98.2°F | Resp 18 | Ht 61.0 in | Wt 229.0 lb

## 2021-06-02 DIAGNOSIS — D75839 Thrombocytosis, unspecified: Secondary | ICD-10-CM | POA: Diagnosis not present

## 2021-06-02 NOTE — Progress Notes (Signed)
[facility] CONSULT NOTE  Patient Care Team: Maeola Harman, MD as PCP - General (Pediatrics)  CHIEF COMPLAINTS/PURPOSE OF CONSULTATION:  Thrombocytosis  ASSESSMENT & PLAN:   This is a pleasant 20 year old female patient with past medical history significant for prediabetes, premature ovarian failure, obesity referred to hematology for evaluation of persistent thrombocytosis.   During her initial consultation, given persistent thrombocytosis we considered MPN work-up which was unremarkable.  We have reviewed that she continues to have persistent mild thrombocytosis which is most likely reactive.  There is no evidence of autoimmune diseases or clear evidence of myeloproliferative disorder or iron deficiency.  At this time I do believe this is likely a reactive thrombocytosis.  Since she is clinically asymptomatic, I would recommend monitoring.  She can return to clinic in a few months to review labs and to discuss any additional recommendations.  She was instructed to contact us with any B symptoms or other new concerns.  She expressed understanding of the recommendations and is agreeable.   HISTORY OF PRESENTING ILLNESS:  Jacqueline Olson 20 y.o. female is here because of thrombocytosis.  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    Interval History  Patient is here for follow-up after her initial consultation for thrombocytosis.  Since her last visit, she feels pretty much the same.  No interim infections, hospitalizations.  She has seen another physician and she was prescribed as needed muscle relaxer for her pain.  Rest of the pertinent 10 point ROS reviewed and negative.  MEDICAL HISTORY:  Past Medical History:  Diagnosis Date   ACL tear 02/05/2016   right knee   Medial meniscus tear  02/05/2016   right knee   Migraines     SURGICAL HISTORY: Past Surgical History:  Procedure Laterality Date   CENTRAL VENOUS CATHETER INSERTION  04/04/2001   KNEE ARTHROSCOPY WITH ANTERIOR CRUCIATE LIGAMENT (ACL) REPAIR WITH HAMSTRING GRAFT Right 03/02/2016   Procedure: RIGHT KNEE ARTHROSCOPY,PARTIAL MEDIAL MENISECTOMY WITH ANTERIOR CRUCIATE LIGAMENT (ACL) REPAIR WITH HAMSTRING AUTOGRAFT;  Surgeon: Loreta Ave, MD;  Location: [facility];  Service: Orthopedics;  Laterality: Right;   KNEE ARTHROSCOPY WITH LATERAL MENISECTOMY Right 03/02/2016   Procedure: KNEE ARTHROSCOPY WITH PARTIAL LATERAL MENISECTOMY;  Surgeon: Loreta Ave, MD;  Location:  SUR[facility];  Service: Orthopedics;  Laterality: Right;    SOCIAL HISTORY: Social History   Socioeconomic History   Marital status: Single    Spouse name: Not on file   Number of children: Not on file   Years of education: Not on file   Highest education level: Not on file  Occupational History   Not on file  Tobacco Use   Smoking status: Never   Smokeless tobacco: Never  Substance and Sexual Activity   Alcohol use: No   Drug use: No   Sexual activity: Not on file  Other Topics Concern   Not on file  Social History Narrative   Not on file   Social Determinants of Health   Financial Resource Strain: Not on file  Food Insecurity: Not on file  Transportation Needs: Not on file  Physical Activity: Not on file  Stress: Not on file  Social Connections: Not on file  Intimate Partner Violence: Not At Risk   Fear of Current or Ex-Partner: No   Emotionally Abused: No   Physically Abused:  No   Sexually Abused: No    FAMILY HISTORY: Family History  Problem Relation Age of Onset   Hypertension Mother    Diabetes Father    Hypertension Father    Kidney disease Paternal Grandfather        ESRD/dialysis   Hypertension Paternal Grandfather    Diabetes Paternal Grandfather    Stroke Paternal Grandfather      ALLERGIES:  has No Known Allergies.  MEDICATIONS:  Current Outpatient Medications  Medication Sig Dispense Refill   cetirizine (ZYRTEC) 10 MG tablet Take 10 mg by mouth daily.     ibuprofen (ADVIL) 200 MG tablet Take 800 mg by mouth every 6 (six) hours as needed for moderate pain.     Levonorgestrel-Ethinyl Estradiol (AMETHIA) 0.1-0.02 & 0.01 MG tablet TAKE 1 TABLET BY MOUTH EVERY DAY 91 tablet 0   metFORMIN (GLUCOPHAGE-XR) 500 MG 24 hr tablet TAKE 2 TABLETS(1000 MG) BY MOUTH DAILY WITH BREAKFAST 180 tablet 3   Vitamin D, Ergocalciferol, (DRISDOL) 1.25 MG (50000 UNIT) CAPS capsule Take 1 capsule by mouth once a week.     Investigational vitamin D 600 UNITS capsule SWOG S0812 Take 600 Units by mouth daily. Take with food.     No current facility-administered medications for this visit.    PHYSICAL EXAMINATION: ECOG PERFORMANCE STATUS: 0 - Asymptomatic  Vitals:   06/02/21 0930  BP: 109/76  Pulse: 95  Resp: 18  Temp: 98.2 F (36.8 C)  SpO2: 99%   Filed Weights   06/02/21 0930  Weight: 229 lb (103.9 kg)    PE deferred in lieu of counseling.  LABORATORY DATA:  I have reviewed the data as listed Lab Results  Component Value Date   WBC 7.2 05/12/2021   HGB 12.1 05/12/2021   HCT 35.8 (L) 05/12/2021   MCV 83.6 05/12/2021   PLT 462 (H) 05/12/2021     Chemistry      Component Value Date/Time   NA 141 05/12/2021 1139   K 4.2 05/12/2021 1139   CL 106 05/12/2021 1139   CO2 24 05/12/2021 1139   BUN 10 05/12/2021 1139   CREATININE 0.72 05/12/2021 1139   CREATININE 0.63 12/13/2017 1538      Component Value Date/Time   CALCIUM 9.1 05/12/2021 1139   ALKPHOS 67 05/12/2021 1139   AST 9 (L) 05/12/2021 1139   ALT 7 05/12/2021 1139   BILITOT 0.3 05/12/2021 1139       RADIOGRAPHIC STUDIES: I have personally reviewed the radiological images as listed and agreed with the findings in the report. No results found.  All questions were answered. The patient knows to call  the clinic with any problems, questions or concerns. I spent 20 minutes in the care of this patient including H and P, review of records, counseling and coordination of care.  We have reviewed labs from last visit.  CBC shows persistent mild thrombocytosis.  No leukocytosis, basophilia or polycythemia.  No concerns on comprehensive metabolic panel.  JAK2 mutation analysis and rest of MPN work-up was negative.  ANA negative. CRP slightly elevated.     Rachel Moulds, MD 06/02/2021 9:58 AM

## 2021-06-02 NOTE — Telephone Encounter (Signed)
Scheduled per los. Gave avs and calendar  

## 2021-06-16 ENCOUNTER — Other Ambulatory Visit: Payer: Self-pay | Admitting: Internal Medicine

## 2021-06-29 ENCOUNTER — Encounter (HOSPITAL_COMMUNITY): Payer: Self-pay

## 2021-06-29 ENCOUNTER — Ambulatory Visit (HOSPITAL_COMMUNITY)
Admission: EM | Admit: 2021-06-29 | Discharge: 2021-06-29 | Disposition: A | Discharge status: Home / Self Care | Payer: Medicaid Other | Attending: Internal Medicine | Admitting: Internal Medicine

## 2021-06-29 ENCOUNTER — Other Ambulatory Visit: Payer: Self-pay

## 2021-06-29 DIAGNOSIS — R0789 Other chest pain: Secondary | ICD-10-CM

## 2021-06-29 MED ORDER — IBUPROFEN 600 MG PO TABS: 600.0000 mg | ORAL_TABLET | Freq: Four times a day (QID) | ORAL | 0 refills | Status: AC | PRN

## 2021-06-29 NOTE — ED Triage Notes (Signed)
Pt presents with left side chest pain since waking up this morning.

## 2021-06-29 NOTE — Discharge Instructions (Addendum)
Gentle stretching exercises Gentle range of motion exercises Take medications as prescribed Your EKG is reassuring If symptoms worsen please return to urgent care to be reevaluated

## 2021-06-29 NOTE — ED Provider Notes (Signed)
[facility]    CSN: [id number] Arrival date & time: 06/29/21  1210      History   Chief Complaint Chief Complaint  Patient presents with   Chest Pain    HPI Jacqueline Olson is a 20 y.o. female comes to the urgent care with left-sided chest pain which started this morning.  Patient describes the pain as sharp, currently about 6 out of 10, aggravated by movement with no known relieving factors.  Stretching seems to make it better.  Pain does not radiate to the neck, shoulder or back.  Patient denies any trauma to the chest or falls.  She works in a [employer] and does a lot of lifting and turning.  No cough or sputum production.  No upper respiratory infection symptoms.  No family history of early cardiac death.  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   Prediabetes 01/06/2015   Morbid obesity (HCC) 01/06/2015   Acanthosis nigricans, acquired 01/06/2015   Dyspepsia 01/06/2015   Essential hypertension, benign 01/06/2015   Goiter 01/06/2015    Past Surgical History:  Procedure Laterality Date   CENTRAL VENOUS CATHETER INSERTION  2001-06-30   KNEE ARTHROSCOPY WITH ANTERIOR CRUCIATE LIGAMENT (ACL) REPAIR WITH HAMSTRING GRAFT Right 03/02/2016   Procedure: RIGHT KNEE ARTHROSCOPY,PARTIAL MEDIAL MENISECTOMY WITH ANTERIOR CRUCIATE LIGAMENT (ACL) REPAIR WITH HAMSTRING AUTOGRAFT;  Surgeon: Loreta Ave, MD;  Location: [facility];  Service:  Orthopedics;  Laterality: Right;   KNEE ARTHROSCOPY WITH LATERAL MENISECTOMY Right 03/02/2016   Procedure: KNEE ARTHROSCOPY WITH PARTIAL LATERAL MENISECTOMY;  Surgeon: Loreta Ave, MD;  Location: [facility];  Service: Orthopedics;  Laterality: Right;    OB History   No obstetric history on file.      Home Medications    Prior to Admission medications   Medication Sig Start Date End Date Taking? Authorizing Provider  cetirizine (ZYRTEC) 10 MG tablet Take 10 mg by mouth daily. 04/19/21   [provider]  ibuprofen (ADVIL) 600 MG tablet Take 1 tablet (600 mg total) by mouth every 6 (six) hours as needed for moderate pain. 06/29/21   Merrilee Jansky, MD  Investigational vitamin D 600 UNITS capsule SWOG [id number] Take 600 Units by mouth daily. Take with food.    [provider]  Levonorgestrel-Ethinyl Estradiol (AMETHIA) 0.1-0.02 & 0.01 MG tablet TAKE 1 TABLET BY MOUTH EVERY DAY 06/20/21   Shamleffer, Konrad Dolores, MD  metFORMIN (GLUCOPHAGE-XR) 500 MG 24 hr tablet TAKE 2 TABLETS(1000 MG) BY MOUTH DAILY WITH BREAKFAST 04/19/21   Shamleffer, Konrad Dolores, MD  Vitamin D, Ergocalciferol, (DRISDOL) 1.25 MG (50000 UNIT) CAPS capsule Take 1 capsule by mouth once a week. 05/05/21   [provider]    Family History Family History  Problem Relation Age of Onset   Hypertension Mother    Diabetes Father    Hypertension Father    Kidney disease Paternal Grandfather  ESRD/dialysis   Hypertension Paternal Grandfather    Diabetes Paternal Grandfather    Stroke Paternal Grandfather     Social History Social History   Tobacco Use   Smoking status: Never   Smokeless tobacco: Never  Substance Use Topics   Alcohol use: No   Drug use: No     Allergies   Patient has no known allergies.   Review of Systems Review of Systems  Constitutional: Negative.   Respiratory:  Negative for chest tightness.   Cardiovascular:  Positive for chest pain.  Negative for palpitations.  Gastrointestinal: Negative.   Neurological: Negative.     Physical Exam Triage Vital Signs ED Triage Vitals  Enc Vitals Group     BP 06/29/21 1303 121/79     Pulse Rate 06/29/21 1303 (!) 101     Resp 06/29/21 1303 18     Temp 06/29/21 1303 98.7 F (37.1 C)     Temp Source 06/29/21 1303 Oral     SpO2 06/29/21 1303 100 %     Weight --      Height --      Head Circumference --      Peak Flow --      Pain Score 06/29/21 1300 5     Pain Loc --      Pain Edu? --      Excl. in GC? --    No data found.  Updated Vital Signs BP 121/79 (BP Location: Right Arm)   Pulse (!) 101   Temp 98.7 F (37.1 C) (Oral)   Resp 18   SpO2 100%   Visual Acuity Right Eye Distance:   Left Eye Distance:   Bilateral Distance:    Right Eye Near:   Left Eye Near:    Bilateral Near:     Physical Exam Vitals and nursing note reviewed.  Constitutional:      General: She is not in acute distress.    Appearance: She is not ill-appearing.  Cardiovascular:     Rate and Rhythm: Normal rate and regular rhythm.     Heart sounds: Normal heart sounds.  Pulmonary:     Effort: Pulmonary effort is normal.  Musculoskeletal:        General: Normal range of motion.  Skin:    General: Skin is warm.  Neurological:     Mental Status: She is alert.     UC Treatments / Results  Labs (all labs ordered are listed, but only abnormal results are displayed) Labs Reviewed - No data to display  EKG   Radiology No results found.  Procedures Procedures (including critical care time)  Medications Ordered in UC Medications - No data to display  Initial Impression / Assessment and Plan / UC Course  I have reviewed the triage vital signs and the nursing notes.  Pertinent labs & imaging results that were available during my care of the patient were reviewed by me and considered in my medical decision making (see chart for details).     1.  Chest wall pain: Ibuprofen 600  mg every 8 hours as needed for pain Gentle range of motion exercises EKG shows normal sinus rhythm with no acute ST or T wave changes. Return precautions given Final Clinical Impressions(s) / UC Diagnoses   Final diagnoses:  Chest wall pain     Discharge Instructions      Gentle stretching exercises Gentle range of motion exercises Take medications as prescribed Your EKG is reassuring If symptoms worsen please  return to urgent care to be reevaluated   ED Prescriptions     Medication Sig Dispense Auth. Provider   ibuprofen (ADVIL) 600 MG tablet Take 1 tablet (600 mg total) by mouth every 6 (six) hours as needed for moderate pain. 30 tablet Stephanee Barcomb, Britta Mccreedy, MD      PDMP not reviewed this encounter.   Merrilee Jansky, MD 06/29/21 [phone]

## 2021-07-12 ENCOUNTER — Encounter (HOSPITAL_BASED_OUTPATIENT_CLINIC_OR_DEPARTMENT_OTHER): Payer: Self-pay

## 2021-07-12 DIAGNOSIS — R0683 Snoring: Secondary | ICD-10-CM

## 2021-07-12 DIAGNOSIS — G4710 Hypersomnia, unspecified: Secondary | ICD-10-CM

## 2021-08-22 ENCOUNTER — Ambulatory Visit (HOSPITAL_BASED_OUTPATIENT_CLINIC_OR_DEPARTMENT_OTHER): Payer: No Typology Code available for payment source | Attending: Sleep Medicine | Admitting: Sleep Medicine

## 2021-08-22 ENCOUNTER — Other Ambulatory Visit: Payer: Self-pay

## 2021-08-22 DIAGNOSIS — R0683 Snoring: Secondary | ICD-10-CM | POA: Insufficient documentation

## 2021-08-22 DIAGNOSIS — G4710 Hypersomnia, unspecified: Secondary | ICD-10-CM

## 2021-08-22 DIAGNOSIS — G4719 Other hypersomnia: Secondary | ICD-10-CM | POA: Diagnosis present

## 2021-09-12 ENCOUNTER — Inpatient Hospital Stay
Payer: No Typology Code available for payment source | Attending: Hematology and Oncology | Admitting: Hematology and Oncology

## 2021-09-12 ENCOUNTER — Other Ambulatory Visit: Payer: Self-pay

## 2021-09-12 ENCOUNTER — Inpatient Hospital Stay: Payer: No Typology Code available for payment source

## 2021-09-12 VITALS — BP 134/100 | HR 82 | Temp 98.4°F | Resp 17 | Ht 61.0 in | Wt 232.0 lb

## 2021-09-12 DIAGNOSIS — D75839 Thrombocytosis, unspecified: Secondary | ICD-10-CM | POA: Insufficient documentation

## 2021-09-12 LAB — COMPREHENSIVE METABOLIC PANEL
ALT: 10 U/L (ref 0–44)
AST: 10 U/L — ABNORMAL LOW (ref 15–41)
Albumin: 3.3 g/dL — ABNORMAL LOW (ref 3.5–5.0)
Alkaline Phosphatase: 67 U/L (ref 38–126)
Anion gap: 9 (ref 5–15)
BUN: 7 mg/dL (ref 6–20)
CO2: 26 mmol/L (ref 22–32)
Calcium: 9.1 mg/dL (ref 8.9–10.3)
Chloride: 106 mmol/L (ref 98–111)
Creatinine, Ser: 0.76 mg/dL (ref 0.44–1.00)
GFR, Estimated: >60 mL/min (ref >60)
Glucose, Bld: 103 mg/dL — ABNORMAL HIGH (ref 70–99)
Potassium: 4.2 mmol/L (ref 3.5–5.1)
Sodium: 141 mmol/L (ref 135–145)
Total Bilirubin: 0.3 mg/dL (ref 0.3–1.2)
Total Protein: 6.6 g/dL (ref 6.5–8.1)

## 2021-09-12 LAB — CBC WITH DIFFERENTIAL/PLATELET
Abs Immature Granulocytes: 0.03 10*3/uL (ref 0.00–0.07)
Basophils Absolute: 0 10*3/uL (ref 0.0–0.1)
Basophils Relative: 0 %
Eosinophils Absolute: 0 10*3/uL (ref 0.0–0.5)
Eosinophils Relative: 1 %
HCT: 36.7 % (ref 36.0–46.0)
Hemoglobin: 12 g/dL (ref 12.0–15.0)
Immature Granulocytes: 1 %
Lymphocytes Relative: 28 %
Lymphs Abs: 1.7 10*3/uL (ref 0.7–4.0)
MCH: 27.6 pg (ref 26.0–34.0)
MCHC: 32.7 g/dL (ref 30.0–36.0)
MCV: 84.6 fL (ref 80.0–100.0)
Monocytes Absolute: 0.3 10*3/uL (ref 0.1–1.0)
Monocytes Relative: 6 %
Neutro Abs: 4 10*3/uL (ref 1.7–7.7)
Neutrophils Relative %: 64 %
Platelets: 456 10*3/uL — ABNORMAL HIGH (ref 150–400)
RBC: 4.34 MIL/uL (ref 3.87–5.11)
RDW: 13.1 % (ref 11.5–15.5)
WBC: 6 10*3/uL (ref 4.0–10.5)
nRBC: 0 % (ref 0.0–0.2)

## 2021-09-12 NOTE — Progress Notes (Signed)
[facility] CONSULT NOTE  Patient Care Team: Maeola Harman, MD as PCP - General (Pediatrics)  CHIEF COMPLAINTS/PURPOSE OF CONSULTATION:  Thrombocytosis  ASSESSMENT & PLAN:   This is a pleasant 20 year old female patient with past medical history significant for prediabetes, premature ovarian failure, obesity referred to hematology for evaluation of persistent thrombocytosis.   During her initial consultation, given persistent thrombocytosis we considered MPN work-up which was unremarkable.   We have reviewed that she continues to have persistent mild thrombocytosis which is likely reactive. CBC from past 2 yrs with no significant change in platelet count. I recommend ongoing monitoring every 6 months or so. Will try to add ferritin to her labs.  She will RTC in 6 months.  HISTORY OF PRESENTING ILLNESS:  Jacqueline Olson 20 y.o. female is here because of thrombocytosis.  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    Interval History  She is here for FU. Since last visit, no complaints. She has headaches from migraine. No other B symptoms. No change in breathing, bowel or urinary habits.  MEDICAL HISTORY:  Past Medical History:  Diagnosis Date   ACL tear 02/05/2016   right knee   Medial meniscus tear 02/05/2016   right knee   Migraines     SURGICAL HISTORY: Past Surgical History:  Procedure Laterality Date   CENTRAL VENOUS CATHETER INSERTION  Jun 11, 2001   KNEE ARTHROSCOPY WITH ANTERIOR CRUCIATE LIGAMENT (ACL) REPAIR WITH HAMSTRING GRAFT Right 03/02/2016   Procedure: RIGHT KNEE ARTHROSCOPY,PARTIAL MEDIAL MENISECTOMY WITH ANTERIOR CRUCIATE LIGAMENT (ACL) REPAIR WITH HAMSTRING AUTOGRAFT;  Surgeon: Loreta Ave, MD;  Location: [facility];  Service: Orthopedics;   Laterality: Right;   KNEE ARTHROSCOPY WITH LATERAL MENISECTOMY Right 03/02/2016   Procedure: KNEE ARTHROSCOPY WITH PARTIAL LATERAL MENISECTOMY;  Surgeon: Loreta Ave, MD;  Location: [facility];  Service: Orthopedics;  Laterality: Right;    SOCIAL HISTORY: Social History   Socioeconomic History   Marital status: Single    Spouse name: Not on file   Number of children: Not on file   Years of education: Not on file   Highest education level: Not on file  Occupational History   Not on file  Tobacco Use   Smoking status: Never   Smokeless tobacco: Never  Substance and Sexual Activity   Alcohol use: No   Drug use: No   Sexual activity: Not on file  Other Topics Concern   Not on file  Social History Narrative   Not on file   Social Determinants of Health   Financial Resource Strain: Not on file  Food Insecurity: Not on file  Transportation Needs: Not on file  Physical Activity: Not on file  Stress: Not on file  Social Connections: Not on file  Intimate Partner Violence: Not At Risk   Fear of Current or Ex-Partner: No   Emotionally Abused: No   Physically Abused: No   Sexually Abused: No    FAMILY HISTORY: Family History  Problem Relation Age of Onset   Hypertension Mother    Diabetes Father    Hypertension Father    Kidney disease Paternal Grandfather        ESRD/dialysis   Hypertension Paternal Grandfather    Diabetes Paternal Grandfather    Stroke Paternal Grandfather     ALLERGIES:  has No Known Allergies.  MEDICATIONS:  Current Outpatient Medications  Medication Sig Dispense Refill   cetirizine (ZYRTEC) 10 MG tablet Take 10 mg by mouth daily.     ibuprofen (ADVIL) 600 MG tablet Take 1 tablet (600 mg total) by mouth every 6 (six) hours as needed for moderate pain. 30 tablet 0   Investigational vitamin D 600 UNITS capsule SWOG S0812 Take 600 Units by mouth daily. Take with food.     Levonorgestrel-Ethinyl Estradiol (AMETHIA) 0.1-0.02 & 0.01  MG tablet TAKE 1 TABLET BY MOUTH EVERY DAY 91 tablet 0   metFORMIN (GLUCOPHAGE-XR) 500 MG 24 hr tablet TAKE 2 TABLETS(1000 MG) BY MOUTH DAILY WITH BREAKFAST 180 tablet 3   Vitamin D, Ergocalciferol, (DRISDOL) 1.25 MG (50000 UNIT) CAPS capsule Take 1 capsule by mouth once a week.     No current facility-administered medications for this visit.    PHYSICAL EXAMINATION: ECOG PERFORMANCE STATUS: 0 - Asymptomatic  Vitals:   09/12/21 0931  BP: (!) 134/100  Pulse: 82  Resp: 17  Temp: 98.4 F (36.9 C)  SpO2: 99%    Filed Weights   09/12/21 0931  Weight: 232 lb (105.2 kg)    Physical Exam Constitutional:      Appearance: Normal appearance. She is obese.  HENT:     Head: Normocephalic and atraumatic.  Cardiovascular:     Rate and Rhythm: Normal rate and regular rhythm.     Pulses: Normal pulses.     Heart sounds: Normal heart sounds.  Pulmonary:     Effort: Pulmonary effort is normal.     Breath sounds: Normal breath sounds.  Abdominal:     General: Abdomen is flat. Bowel sounds are normal.  Musculoskeletal:        General: No swelling or tenderness.     Cervical back: Normal range of motion and neck supple. No rigidity.  Lymphadenopathy:     Cervical: No cervical adenopathy.  Skin:    General: Skin is warm and dry.  Neurological:     General: No focal deficit present.     Mental Status: She is alert.  Psychiatric:        Mood and Affect: Mood normal.      LABORATORY DATA:  I have reviewed the data as listed Lab Results  Component Value Date   WBC 6.0 09/12/2021   HGB 12.0 09/12/2021   HCT 36.7 09/12/2021   MCV 84.6 09/12/2021   PLT 456 (H) 09/12/2021     Chemistry      Component Value Date/Time   NA 141 09/12/2021 0915   K 4.2 09/12/2021 0915   CL 106 09/12/2021 0915   CO2 26 09/12/2021 0915   BUN 7 09/12/2021 0915   CREATININE 0.76 09/12/2021 0915   CREATININE 0.63 12/13/2017 1538      Component Value Date/Time   CALCIUM 9.1 09/12/2021 0915    ALKPHOS 67 09/12/2021 0915   AST 10 (L) 09/12/2021 0915   ALT 10 09/12/2021 0915   BILITOT 0.3 09/12/2021 0915     RADIOGRAPHIC STUDIES: I have personally reviewed the radiological images as listed and agreed with the findings in the report.  No results found.  All questions were answered. The patient knows to call the clinic with any problems, questions or concerns. I spent 20 minutes in the care of this patient including H and P, review of records, counseling and coordination of care.  JAK2 mutation analysis and rest of MPN work-up was negative.  ANA negative. CRP slightly elevated. CBC from today with  stable platelet count Elevated fasting glucose likely from pre diabetes    Rachel Moulds, MD 09/12/2021 11:33 AM

## 2021-09-12 NOTE — Procedures (Signed)
   NAME: [name] DATE OF BIRTH:  [birth year] MEDICAL RECORD NUMBER [id number]  LOCATION: [facility]  PHYSICIAN: Prithvi Kooi D Jane Birkel  DATE OF STUDY: 08/22/2021  SLEEP STUDY TYPE: Nocturnal Polysomnogram               REFERRING PHYSICIAN: Alanda Slim, MD  CLINICAL INFORMATION Jacqueline Olson is a 20 year old Female and was referred to the sleep center for evaluation of OSA (G47.80).   MEDICATIONS Patient self administered medications include: Melatonin. No sleep medicine administered.  SLEEP STUDY TECHNIQUE A multi-channel overnight Polysomnography study was performed. The channels recorded and monitored were central and occipital EEG, electrooculogram (EOG), submentalis EMG (chin), nasal and oral airflow, thoracic and abdominal wall motion, anterior tibialis EMG, snore microphone, electrocardiogram, and a pulse oximetry. SLEEP ARCHITECTURE The study was initiated at 12:57:25 AM and terminated at 7:03:31 AM. The total recorded time was 366.1 minutes. EEG confirmed total sleep time was 304 minutes yielding a sleep efficiency of 83.0%. Sleep onset after lights out was 19.9 minutes with a REM latency of 151.5 minutes. The patient spent 8.7% of the night in stage N1 sleep, 57.6% in stage N2 sleep, 8.9% in stage N3 and 24.8% in REM. Wake after sleep onset (WASO) was 42.2 minutes. The Arousal Index was 8.1/hour. RESPIRATORY PARAMETERS There were a total of 2 respiratory disturbances out of which 1 were apneas (0 obstructive, 0 mixed, 1 central) and 1 hypopneas. The apnea/hypopnea index (AHI) was 0.4 events/hour. The central sleep apnea index was 0.2 events/hour. The REM AHI was 1.6 events/hour and NREM AHI was 0.0 events/hour. The supine AHI was N/A events/hour and the non supine AHI was 0.4 supine during 0.00% of sleep. Respiratory disturbance index was 3.8 events/hour and associated with an oxygen desaturation down to a nadir of 92.0% during sleep. The mean oxygen  saturation during the study was 96.9%.  LEG MOVEMENT DATA The total leg movements were 0 with a resulting leg movement index of 0.0/hr .Associated arousal with leg movement index was 0.0/hr.  CARDIAC DATA The underlying cardiac rhythm was most consistent with sinus rhythm. Mean heart rate during sleep was 74.2 bpm. Additional rhythm abnormalities include None. IMPRESSIONS - No Significant Obstructive Sleep apnea (OSA) DIAGNOSIS - Primary Snoring (R06.83) RECOMMENDATIONS - Recommend a trial of Oral Appliance for snoring.  - Avoid alcohol, sedatives and other CNS depressants that may worsen sleep apnea and disrupt normal sleep architecture. - Sleep hygiene should be reviewed to assess factors that may improve sleep quality. - Weight management and regular exercise should be initiated or continued.   Rohith Fauth D Weyman Bogdon Diplomate, American Board of Internal Medicine  ELECTRONICALLY SIGNED ON:  09/12/2021, 12:57 PM [facility] PH: [phone]   FX: [phone] ACCREDITED BY THE AMERICAN ACADEMY OF SLEEP MEDICINE

## 2021-09-13 ENCOUNTER — Encounter: Payer: Self-pay | Admitting: Hematology and Oncology

## 2021-09-13 ENCOUNTER — Telehealth: Payer: Self-pay | Admitting: Hematology and Oncology

## 2021-09-13 NOTE — Telephone Encounter (Signed)
Per 10/3 sch msg, pt was called and confirmed appt 

## 2021-09-19 NOTE — Progress Notes (Deleted)
Name: Jacqueline Olson  MRN/ DOB: [id number], February 19, 2001    Age/ Sex: 20 y.o., female     PCP: Maeola Harman, MD   Reason for Endocrinology Evaluation: Premature Ovarian Failure     Initial Endocrinology Clinic Visit: 11/17/19    PATIENT IDENTIFIER: Jacqueline Olson is a 20 y.o., female with a past medical history of Prediabetes, obesity and premature ovarian failure  . She has followed with [facility] since 11/17/2019 for consultative assistance with management of her Premature ovarian failure.   HISTORICAL SUMMARY: The patient was first diagnosed with premature ovarian failure during evaluation of irregular menses in 04/2019 with an FSH of 58 mIU/mL and LH 34.3 mIU/mL  With an estradiol of 11.2 pg/mL. She was started on Combined OCP . 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   Per pt she had menarche at age 20. Her menstruations have been regular until the age of 13 when her periods have become irregular.  She was also started on metformin for pre-diabetes  Graduated from high school,currently in cosmetology school  Father with diabetes.  Maternal aunt with thyroid disease.    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 No evidence of Turner syndrome and it was determined no cardiology referral is needed and this deletion only causes premature ovarian failure .  No Hx of premature ovarian failure in the family  SUBJECTIVE:    Today (09/19/2021):  Jacqueline Olson is here for a follow up on her diagnosis of premature ovarian failure, prediabetes and MNG   Denies nausea or vomiting  Denies any local neck symptoms.  Denies hot flashes  She is not exercising     COC's Metformin 500 mg 2 tabs daily   HISTORY:  Past Medical History:  Past Medical History:  Diagnosis Date   ACL tear 02/05/2016   right knee   Medial meniscus tear  02/05/2016   right knee   Migraines    Past Surgical History:  Past Surgical History:  Procedure Laterality Date   CENTRAL VENOUS CATHETER INSERTION  2001/07/06   KNEE ARTHROSCOPY WITH ANTERIOR CRUCIATE LIGAMENT (ACL) REPAIR WITH HAMSTRING GRAFT Right 03/02/2016   Procedure: RIGHT KNEE ARTHROSCOPY,PARTIAL MEDIAL MENISECTOMY WITH ANTERIOR CRUCIATE LIGAMENT (ACL) REPAIR WITH HAMSTRING AUTOGRAFT;  Surgeon: Loreta Ave, MD;  Location: [facility];  Service: Orthopedics;  Laterality: Right;   KNEE ARTHROSCOPY WITH LATERAL MENISECTOMY Right 03/02/2016   Procedure: KNEE ARTHROSCOPY WITH PARTIAL LATERAL MENISECTOMY;  Surgeon: Loreta Ave, MD;  Location: [facility];  Service: Orthopedics;  Laterality: Right;   Social History:  reports that she has never smoked. She has never used smokeless tobacco. She reports that she does not drink alcohol and does not use drugs. Family History:  Family History  Problem Relation Age of Onset   Hypertension Mother    Diabetes Father    Hypertension Father    Kidney disease Paternal Grandfather        ESRD/dialysis   Hypertension Paternal Grandfather    Diabetes Paternal Grandfather    Stroke Paternal Grandfather      HOME MEDICATIONS: Allergies as of 09/21/2021   No Known Allergies      Medication List        Accurate as of September 19, 2021  2:58 PM. If you have any questions, ask your nurse or doctor.  cetirizine 10 MG tablet Commonly known as: ZYRTEC Take 10 mg by mouth daily.   ibuprofen 600 MG tablet Commonly known as: ADVIL Take 1 tablet (600 mg total) by mouth every 6 (six) hours as needed for moderate pain.   Investigational vitamin D 600 UNITS capsule SWOG S0812 Take 600 Units by mouth daily. Take with food.   Levonorgestrel-Ethinyl Estradiol 0.1-0.02 & 0.01 MG tablet Commonly known as: AMETHIA TAKE 1 TABLET BY MOUTH EVERY DAY   metFORMIN 500 MG 24 hr tablet Commonly known as:  GLUCOPHAGE-XR TAKE 2 TABLETS(1000 MG) BY MOUTH DAILY WITH BREAKFAST   Vitamin D (Ergocalciferol) 1.25 MG (50000 UNIT) Caps capsule Commonly known as: DRISDOL Take 1 capsule by mouth once a week.          OBJECTIVE:   PHYSICAL EXAM: VS: There were no vitals taken for this visit.   EXAM: General: Pt appears well and is in NAD  Lungs: Clear with good BS bilat with no rales, rhonchi, or wheezes  Heart: Auscultation: RRR.  Abdomen: Normoactive bowel sounds, soft, nontender, without masses or organomegaly palpable  Extremities:  BL LE: No pretibial edema normal ROM and strength.  Mental Status: Judgment, insight: Intact Orientation: Oriented to time, place, and person Mood and affect: No depression, anxiety, or agitation     DATA REVIEWED:  Results for Jacqueline, Olson (MRN [id number]) as of 03/22/2021 08:21  Ref. Range 03/21/2021 10:14  Sodium Latest Ref Range: 135 - 145 mEq/L 140  Potassium Latest Ref Range: 3.5 - 5.1 mEq/L 4.3  Chloride Latest Ref Range: 96 - 112 mEq/L 103  CO2 Latest Ref Range: 19 - 32 mEq/L 29  Glucose Latest Ref Range: 70 - 99 mg/dL 99  BUN Latest Ref Range: 6 - 23 mg/dL 10  Creatinine Latest Ref Range: 0.40 - 1.20 mg/dL 5.40  Calcium Latest Ref Range: 8.4 - 10.5 mg/dL 9.3  Alkaline Phosphatase Latest Ref Range: 39 - 117 U/L 65  Albumin Latest Ref Range: 3.5 - 5.2 g/dL 3.8  AST Latest Ref Range: 0 - 37 U/L 12  ALT Latest Ref Range: 0 - 35 U/L 9  Total Protein Latest Ref Range: 6.0 - 8.3 g/dL 7.0  Total Bilirubin Latest Ref Range: 0.2 - 1.2 mg/dL 0.2  GFR Latest Ref Range: >60.00 mL/min 114.93  Total CHOL/HDL Ratio Unknown 3  Cholesterol Latest Ref Range: 0 - 200 mg/dL 086  HDL Cholesterol Latest Ref Range: >39.00 mg/dL 76.19  LDL (calc) Latest Ref Range: 0 - 99 mg/dL 509 (H)  NonHDL Unknown 131.41  Triglycerides Latest Ref Range: 0.0 - 149.0 mg/dL 32.6  VLDL Latest Ref Range: 0.0 - 40.0 mg/dL 9.6  Hemoglobin Z1I Latest Ref Range: 4.6 - 6.5 % 6.2   TSH Latest Ref Range: 0.35 - 5.50 uIU/mL 0.93  T4,Free(Direct) Latest Ref Range: 0.60 - 1.60 ng/dL 4.58    ASSESSMENT / PLAN / RECOMMENDATIONS:   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 Deletions of the X long arm have correlated with premature menopause or gonadal dysgenesis.  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 No evidence of Turner syndrome and  it was determined no cardiology referral is needed and this deletion only causes premature ovarian failure .  - She is tolerating COC's without side effects   -Patient to schedule an appointment with Gyn as she has not had menses since last year    Medications :  Continue COC's      2. Pre-Diabetes :   -She encouraged again to start lifestyle changes with low carb diet, and exercise, she continues to gain weight   -Repeat A1c today is higher at 6.2% - Lipid panel stable    Medication  Metformin 500 mg , 2 tabs daily     3. Multinodular Goiter :  - She is clinically and biochemically euthyroid  - No local neck symptoms - Repeat ultrasound 09/2020 did not show any evidence of thyroid nodules       F/U in 6 months   Signed electronically by: Lyndle Herrlich, MD  [facility]  [facility] [address] [place] [place], [place] [address] Phone: [phone] FAX: [phone]      CC: Maeola Harman, MD [address] [place] [address] Phone: [phone]  Fax: [phone]   Return to Endocrinology clinic as below: Future Appointments  Date Time Provider Department Center   09/20/2021  9:45 AM [facility] None  09/21/2021  9:30 AM Jacquelynn Friend, Konrad Dolores, MD [facility] None

## 2021-09-20 ENCOUNTER — Other Ambulatory Visit: Payer: Self-pay

## 2021-09-20 ENCOUNTER — Inpatient Hospital Stay: Payer: No Typology Code available for payment source

## 2021-09-20 DIAGNOSIS — D75839 Thrombocytosis, unspecified: Secondary | ICD-10-CM

## 2021-09-20 LAB — COMPREHENSIVE METABOLIC PANEL
ALT: 10 U/L (ref 0–44)
AST: 12 U/L — ABNORMAL LOW (ref 15–41)
Albumin: 3.4 g/dL — ABNORMAL LOW (ref 3.5–5.0)
Alkaline Phosphatase: 74 U/L (ref 38–126)
Anion gap: 10 (ref 5–15)
BUN: 9 mg/dL (ref 6–20)
CO2: 23 mmol/L (ref 22–32)
Calcium: 9.2 mg/dL (ref 8.9–10.3)
Chloride: 106 mmol/L (ref 98–111)
Creatinine, Ser: 0.73 mg/dL (ref 0.44–1.00)
GFR, Estimated: >60 mL/min (ref >60)
Glucose, Bld: 102 mg/dL — ABNORMAL HIGH (ref 70–99)
Potassium: 4.2 mmol/L (ref 3.5–5.1)
Sodium: 139 mmol/L (ref 135–145)
Total Bilirubin: 0.4 mg/dL (ref 0.3–1.2)
Total Protein: 7 g/dL (ref 6.5–8.1)

## 2021-09-20 LAB — IRON AND TIBC
Iron: 63 ug/dL (ref 41–142)
Saturation Ratios: 17 % — ABNORMAL LOW (ref 21–57)
TIBC: 360 ug/dL (ref 236–444)
UIBC: 297 ug/dL (ref 120–384)

## 2021-09-20 LAB — CBC WITH DIFFERENTIAL/PLATELET
Abs Immature Granulocytes: 0.02 10*3/uL (ref 0.00–0.07)
Basophils Absolute: 0 10*3/uL (ref 0.0–0.1)
Basophils Relative: 0 %
Eosinophils Absolute: 0.1 10*3/uL (ref 0.0–0.5)
Eosinophils Relative: 1 %
HCT: 35.9 % — ABNORMAL LOW (ref 36.0–46.0)
Hemoglobin: 12.3 g/dL (ref 12.0–15.0)
Immature Granulocytes: 0 %
Lymphocytes Relative: 23 %
Lymphs Abs: 1.6 10*3/uL (ref 0.7–4.0)
MCH: 28.1 pg (ref 26.0–34.0)
MCHC: 34.3 g/dL (ref 30.0–36.0)
MCV: 82 fL (ref 80.0–100.0)
Monocytes Absolute: 0.4 10*3/uL (ref 0.1–1.0)
Monocytes Relative: 5 %
Neutro Abs: 5 10*3/uL (ref 1.7–7.7)
Neutrophils Relative %: 71 %
Platelets: 422 10*3/uL — ABNORMAL HIGH (ref 150–400)
RBC: 4.38 MIL/uL (ref 3.87–5.11)
RDW: 13.2 % (ref 11.5–15.5)
WBC: 7.1 10*3/uL (ref 4.0–10.5)
nRBC: 0 % (ref 0.0–0.2)

## 2021-09-20 LAB — HEMOGLOBIN A1C
Hgb A1c MFr Bld: 6.1 % — ABNORMAL HIGH (ref 4.8–5.6)
Mean Plasma Glucose: 128.37 mg/dL

## 2021-09-20 LAB — FERRITIN: Ferritin: 64 ng/mL (ref 11–307)

## 2021-09-21 ENCOUNTER — Ambulatory Visit: Payer: Medicaid Other | Admitting: Internal Medicine

## 2021-09-22 ENCOUNTER — Ambulatory Visit (INDEPENDENT_AMBULATORY_CARE_PROVIDER_SITE_OTHER): Payer: No Typology Code available for payment source | Admitting: Internal Medicine

## 2021-09-22 ENCOUNTER — Encounter: Payer: Self-pay | Admitting: Internal Medicine

## 2021-09-22 ENCOUNTER — Other Ambulatory Visit: Payer: Self-pay

## 2021-09-22 VITALS — BP 116/72 | HR 92 | Ht 61.0 in | Wt 233.0 lb

## 2021-09-22 DIAGNOSIS — E010 Iodine-deficiency related diffuse (endemic) goiter: Secondary | ICD-10-CM | POA: Diagnosis not present

## 2021-09-22 DIAGNOSIS — R7303 Prediabetes: Secondary | ICD-10-CM | POA: Diagnosis not present

## 2021-09-22 DIAGNOSIS — Q991 46, XX true hermaphrodite: Secondary | ICD-10-CM | POA: Diagnosis not present

## 2021-09-22 MED ORDER — METFORMIN HCL ER 500 MG PO TB24
1000.0000 mg | ORAL_TABLET | Freq: Every day | ORAL | 3 refills | Status: DC
Start: 2021-09-22 — End: 2021-11-15

## 2021-09-22 MED ORDER — LEVONORGEST-ETH ESTRAD 91-DAY 0.1-0.02 & 0.01 MG PO TABS
1.0000 | ORAL_TABLET | Freq: Every day | ORAL | 3 refills | Status: DC
Start: 2021-09-22 — End: 2022-03-24

## 2021-09-22 NOTE — Progress Notes (Signed)
Name: Jacqueline Olson  MRN/ DOB: [id number], July 13, 2001    Age/ Sex: 20 y.o., female     PCP: Deatra James, MD   Reason for Endocrinology Evaluation: Premature Ovarian Failure     Initial Endocrinology Clinic Visit: 11/17/2019    PATIENT IDENTIFIER: [name] is a 20 y.o., female with a past medical history of Prediabetes, obesity and premature ovarian failure  . She has followed with [facility] since 11/17/2019 for consultative assistance with management of her Premature ovarian failure.   HISTORICAL SUMMARY: The patient was first diagnosed with premature ovarian failure during evaluation of irregular menses in 04/2019 with an FSH of 58 mIU/mL and LH 34.3 mIU/mL  With an estradiol of 11.2 pg/mL. She was started on Combined OCP . 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   Per pt she had menarche at age 83. Her menstruations have been regular until the age of 36 when her periods have become irregular.  She was also started on metformin for pre-diabetes  Graduated from high school,currently in cosmetology school  Father with diabetes.  Maternal aunt with thyroid disease.    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 No evidence of Turner syndrome and it was determined no cardiology referral is needed and this deletion only causes premature ovarian failure .  No Hx of premature ovarian failure in the family  SUBJECTIVE:    Today (09/22/2021):  Jacqueline Olson is here for a follow up on her diagnosis of premature ovarian failure, prediabetes and MNG  Denies nausea, vomiting and diarrhea  Has occasional chronic headaches , has occasional nasal congestion - tylenol helps at times.  Denies vision changes  Has occasional hot flashes  She is not exercising  Admits to occasional forgetting medication, and dietary  indiscretions    COC's Metformin 500 mg 2 tabs daily   HISTORY:  Past Medical History:  Past Medical History:  Diagnosis Date   ACL tear 02/05/2016   right knee   Medial meniscus tear 02/05/2016   right knee   Migraines    Past Surgical History:  Past Surgical History:  Procedure Laterality Date   CENTRAL VENOUS CATHETER INSERTION  09-18-2001   KNEE ARTHROSCOPY WITH ANTERIOR CRUCIATE LIGAMENT (ACL) REPAIR WITH HAMSTRING GRAFT Right 03/02/2016   Procedure: RIGHT KNEE ARTHROSCOPY,PARTIAL MEDIAL MENISECTOMY WITH ANTERIOR CRUCIATE LIGAMENT (ACL) REPAIR WITH HAMSTRING AUTOGRAFT;  Surgeon: Loreta Ave, MD;  Location: [facility];  Service: Orthopedics;  Laterality: Right;   KNEE ARTHROSCOPY WITH LATERAL MENISECTOMY Right 03/02/2016   Procedure: KNEE ARTHROSCOPY WITH PARTIAL LATERAL MENISECTOMY;  Surgeon: Loreta Ave, MD;  Location: [facility];  Service: Orthopedics;  Laterality: Right;   Social History:  reports that she has never smoked. She has never used smokeless tobacco. She reports that she does not drink alcohol and does not use drugs. Family History:  Family History  Problem Relation Age of Onset   Hypertension Mother    Diabetes Father    Hypertension Father    Kidney disease Paternal Grandfather        ESRD/dialysis   Hypertension Paternal Grandfather    Diabetes Paternal Grandfather    Stroke Paternal Grandfather      HOME MEDICATIONS: Allergies as of 09/22/2021   No Known Allergies      Medication List  Accurate as of September 22, 2021  8:09 AM. If you have any questions, ask your nurse or doctor.          cetirizine 10 MG tablet Commonly known as: ZYRTEC Take 10 mg by mouth daily.   ibuprofen 600 MG tablet Commonly known as: ADVIL Take 1 tablet (600 mg total) by mouth every 6 (six) hours as needed for moderate pain.   Investigational vitamin D 600 UNITS capsule SWOG S0812 Take 600 Units by mouth daily. Take  with food.   Levonorgestrel-Ethinyl Estradiol 0.1-0.02 & 0.01 MG tablet Commonly known as: AMETHIA TAKE 1 TABLET BY MOUTH EVERY DAY   metFORMIN 500 MG 24 hr tablet Commonly known as: GLUCOPHAGE-XR TAKE 2 TABLETS(1000 MG) BY MOUTH DAILY WITH BREAKFAST   Vitamin D (Ergocalciferol) 1.25 MG (50000 UNIT) Caps capsule Commonly known as: DRISDOL Take 1 capsule by mouth once a week.          OBJECTIVE:   PHYSICAL EXAM: VS: BP 116/72 (BP Location: Left Arm, Patient Position: Sitting, Cuff Size: Small)   Pulse 92   Ht 5\' 1"  (1.549 m)   Wt 233 lb (105.7 kg)   SpO2 99%   BMI 44.02 kg/m    EXAM: General: Pt appears well and is in NAD Right thyroid enlargement appreciated, normal left lobe   Lungs: Clear with good BS bilat with no rales, rhonchi, or wheezes  Heart: Auscultation: RRR.  Abdomen: Normoactive bowel sounds, soft, nontender, without masses or organomegaly palpable  Extremities:  BL LE: No pretibial edema normal ROM and strength.  Mental Status: Judgment, insight: Intact Orientation: Oriented to time, place, and person Mood and affect: No depression, anxiety, or agitation     DATA REVIEWED: Results for Jacqueline Olson, Jacqueline Olson (MRN Angelita Ingles) as of 09/22/2021 08:01  Ref. Range 09/20/2021 10:07 09/20/2021 10:11  COMPREHENSIVE METABOLIC PANEL Unknown  Rpt (A)  Sodium Latest Ref Range: 135 - 145 mmol/L  139  Potassium Latest Ref Range: 3.5 - 5.1 mmol/L  4.2  Chloride Latest Ref Range: 98 - 111 mmol/L  106  CO2 Latest Ref Range: 22 - 32 mmol/L  23  Glucose Latest Ref Range: 70 - 99 mg/dL  11/20/2021 (H)  Mean Plasma Glucose Latest Units: mg/dL 326   BUN Latest Ref Range: 6 - 20 mg/dL  9  Creatinine Latest Ref Range: 0.44 - 1.00 mg/dL  712.45  Calcium Latest Ref Range: 8.9 - 10.3 mg/dL  9.2  Anion gap Latest Ref Range: 5 - 15   10  Alkaline Phosphatase Latest Ref Range: 38 - 126 U/L  74  Albumin Latest Ref Range: 3.5 - 5.0 g/dL  3.4 (L)  AST Latest Ref Range: 15 - 41 U/L  12  (L)  ALT Latest Ref Range: 0 - 44 U/L  10  Total Protein Latest Ref Range: 6.5 - 8.1 g/dL  7.0  Total Bilirubin Latest Ref Range: 0.3 - 1.2 mg/dL  0.4  GFR, Estimated Latest Ref Range: >60 mL/min  >60  Iron Latest Ref Range: 41 - 142 ug/dL  63  UIBC Latest Ref Range: 120 - 384 ug/dL  8.09  TIBC Latest Ref Range: 236 - 444 ug/dL  983  Saturation Ratios Latest Ref Range: 21 - 57 %  17 (L)  Ferritin Latest Ref Range: 11 - 307 ng/mL  64  WBC Latest Ref Range: 4.0 - 10.5 K/uL  7.1  RBC Latest Ref Range: 3.87 - 5.11 MIL/uL  4.38  Hemoglobin Latest Ref Range: 12.0 - 15.0  g/dL  00.1  HCT Latest Ref Range: 36.0 - 46.0 %  35.9 (L)  MCV Latest Ref Range: 80.0 - 100.0 fL  82.0  MCH Latest Ref Range: 26.0 - 34.0 pg  28.1  MCHC Latest Ref Range: 30.0 - 36.0 g/dL  74.9  RDW Latest Ref Range: 11.5 - 15.5 %  13.2  Platelets Latest Ref Range: 150 - 400 K/uL  422 (H)  nRBC Latest Ref Range: 0.0 - 0.2 %  0.0  Neutrophils Latest Units: %  71  Lymphocytes Latest Units: %  23  Monocytes Relative Latest Units: %  5  Eosinophil Latest Units: %  1  Basophil Latest Units: %  0  Immature Granulocytes Latest Units: %  0  NEUT# Latest Ref Range: 1.7 - 7.7 K/uL  5.0  Lymphocyte # Latest Ref Range: 0.7 - 4.0 K/uL  1.6  Monocyte # Latest Ref Range: 0.1 - 1.0 K/uL  0.4  Eosinophils Absolute Latest Ref Range: 0.0 - 0.5 K/uL  0.1  Basophils Absolute Latest Ref Range: 0.0 - 0.1 K/uL  0.0  Abs Immature Granulocytes Latest Ref Range: 0.00 - 0.07 K/uL  0.02  Hemoglobin A1C Latest Ref Range: 4.8 - 5.6 % 6.1 (H)      ASSESSMENT / PLAN / RECOMMENDATIONS:   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 Deletions of the X long arm have correlated with premature menopause or gonadal dysgenesis.  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 No evidence of Turner syndrome and it was determined no cardiology referral is needed and this deletion only causes premature ovarian failure .  - She is tolerating COC's without side effects    Medications :  Continue Amethia 0.1-0.02 daily       2. Pre-Diabetes :   -She encouraged again to exercise, avoid sugar-sweetened beverages and low carb diet    - A1c stable at 6.1%   Medication  Metformin 500 mg , 2 tabs daily     3. Multinodular Goiter :  - She is clinically and biochemically euthyroid  - No local neck symptoms, but right thyroid lobe asymmetry noted  - Repeat ultrasound 09/2020 did not show any evidence of thyroid nodules , will consider       F/U in 6 months   Signed electronically by: Lyndle Herrlich, MD  [facility]  [facility] [address] [place], [place] [address] Phone: [phone] FAX: [phone]      CC: Deatra James, MD [address] [place] [address] Phone: [phone]  Fax: [phone]   Return to Endocrinology clinic as below: No future appointments.

## 2021-10-08 IMAGING — US US THYROID
1 series · 13 of 25 positions shown · non-contrast
Comparison: None.

CLINICAL DATA: Thyromegaly

EXAM:
THYROID ULTRASOUND
TECHNIQUE: Ultrasound examination of the thyroid gland and adjacent soft
tissues was performed.

[Series 1: us thyroid · 0.07mm/px · 13 of 46 slices shown]
[im 1/46]
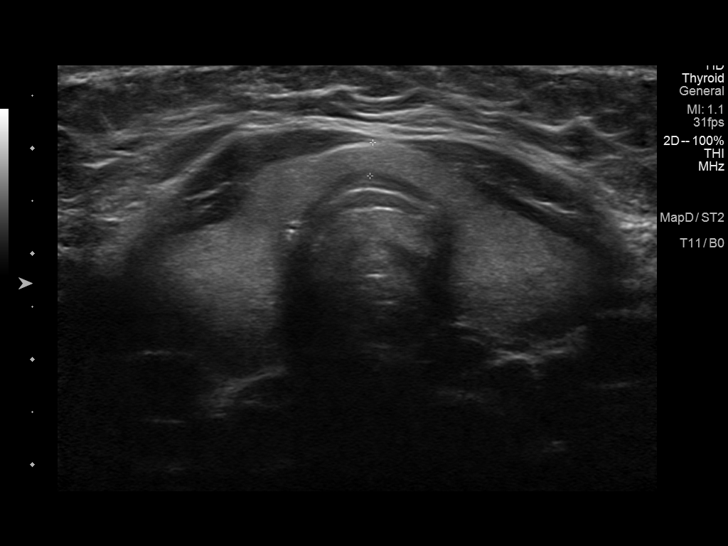
[im 4/46]
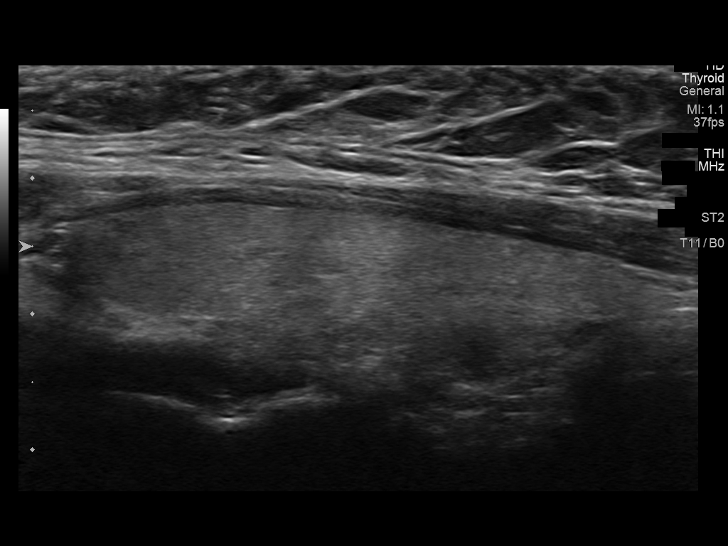
[im 8/46]
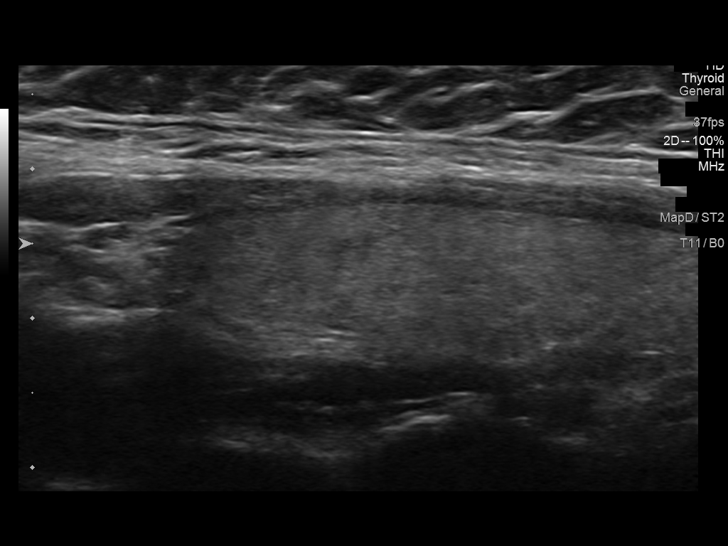
[im 12/46]
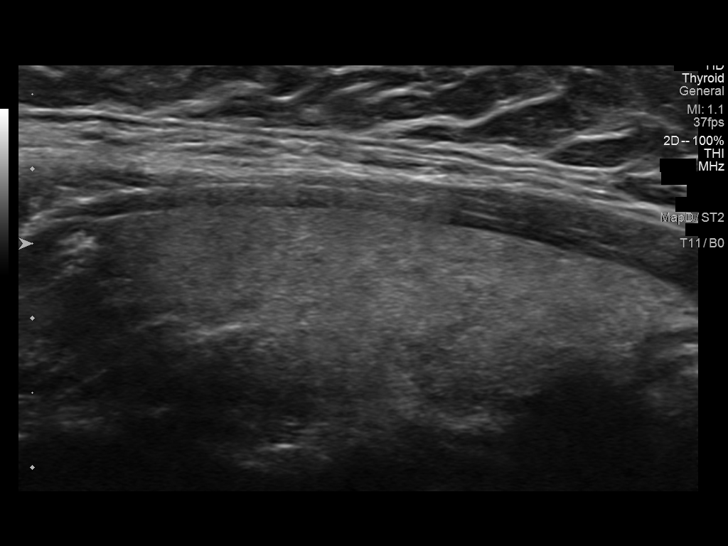
[im 16/46]
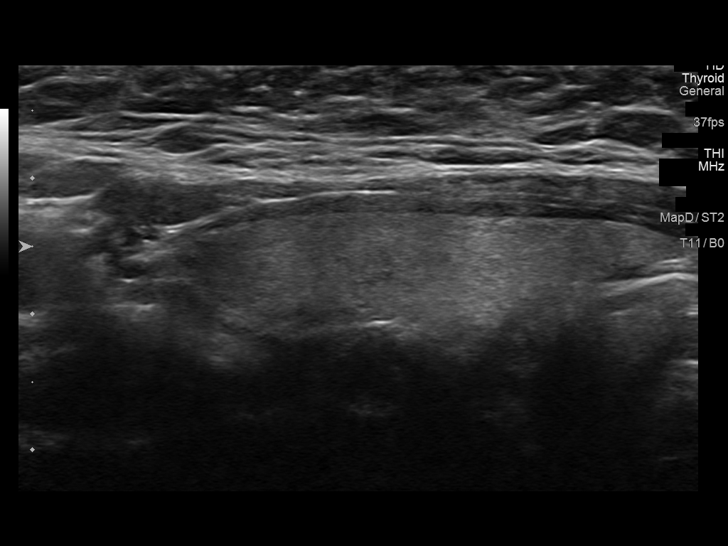
[im 19/46]
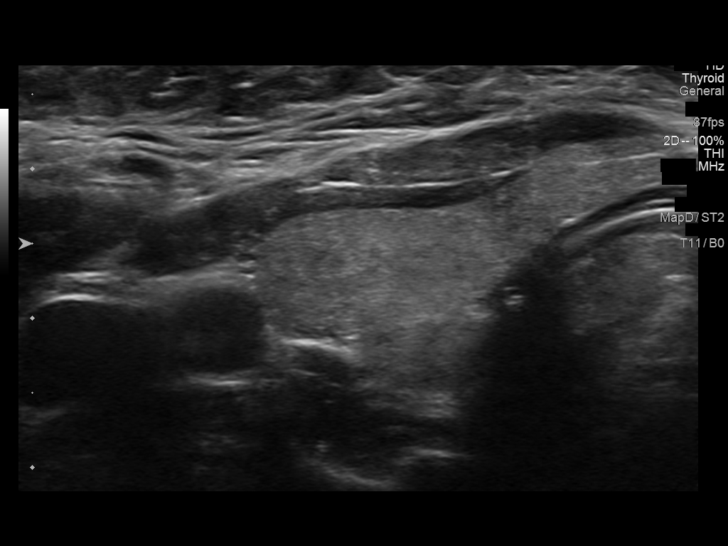
[im 23/46]
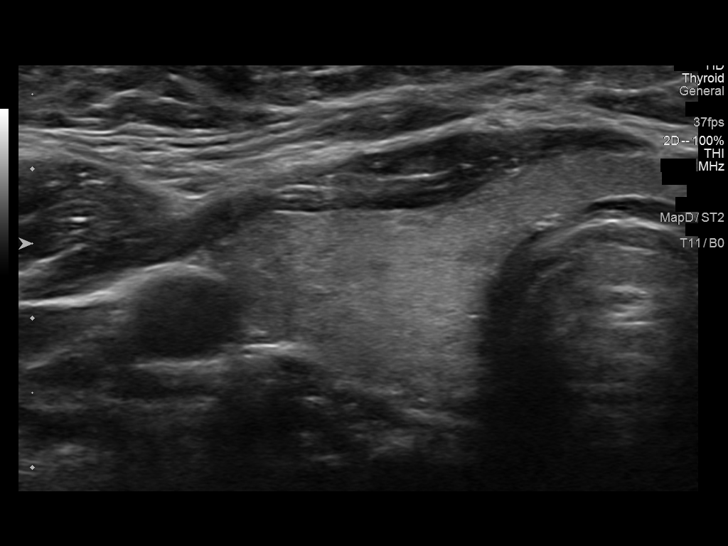
[im 27/46]
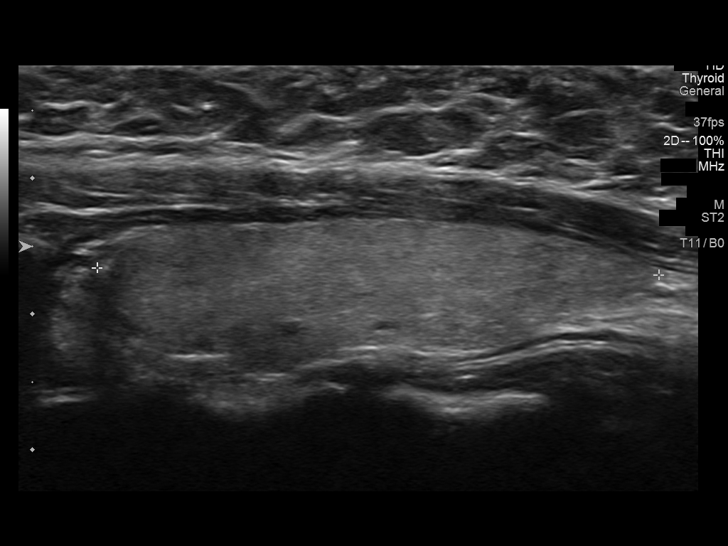
[im 31/46]
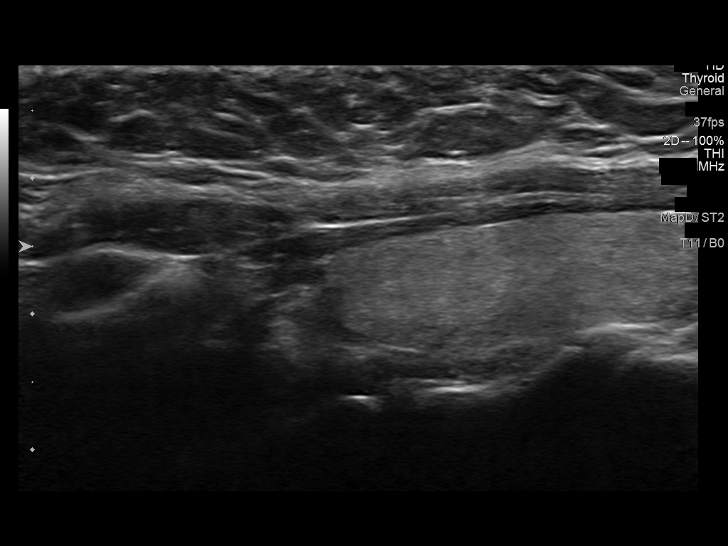
[im 34/46]
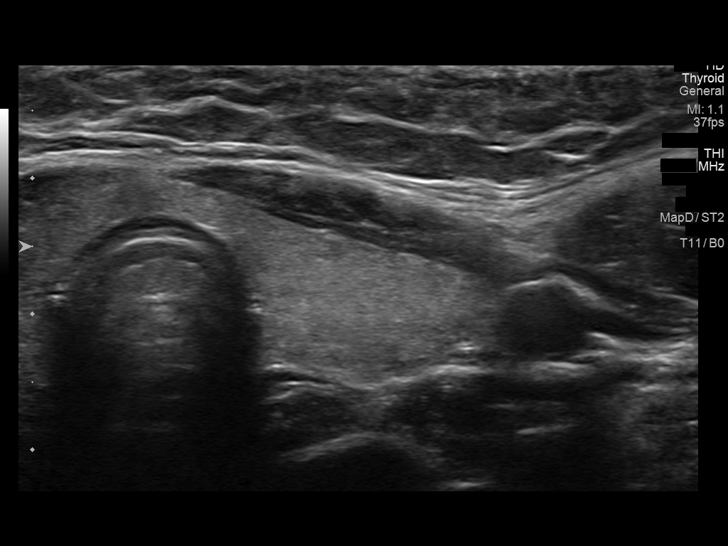
[im 38/46]
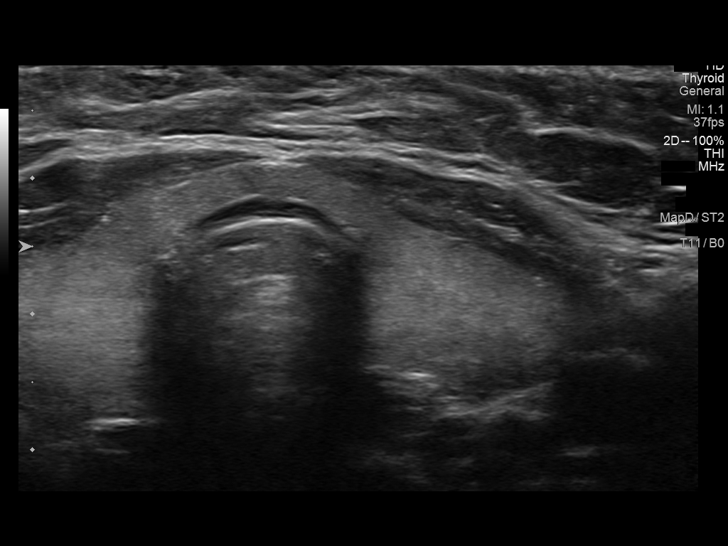
[im 42/46]
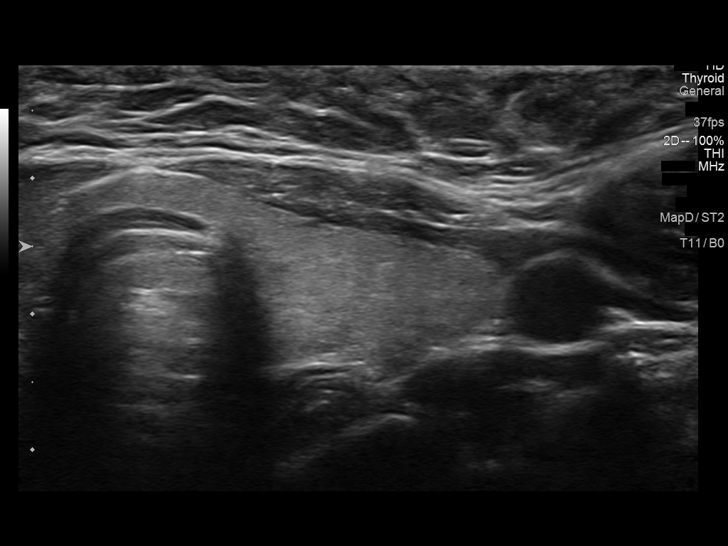
[im 46/46]
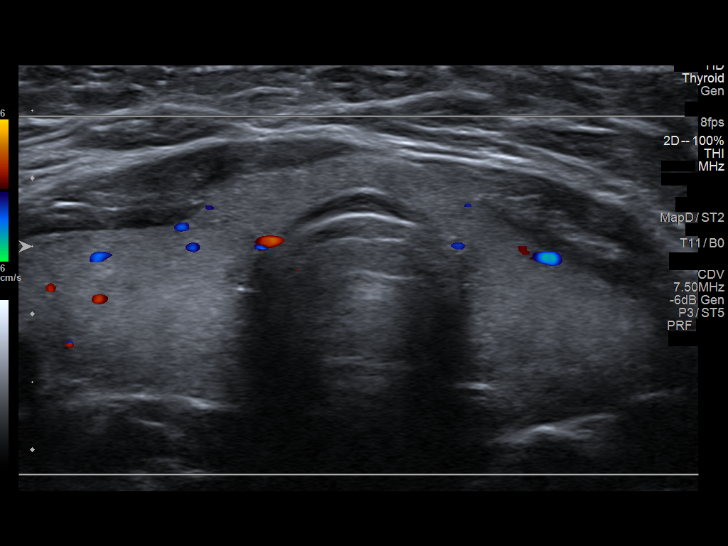

[13 of 25 positions shown; findings below may reference images not displayed]

FINDINGS: Parenchymal Echotexture: Normal

Isthmus: 0.3 cm

Right lobe: 4.5 x 1.3 x 1.8 cm

Left lobe: 4.1 x 1.1 x 1.6 cm

_________________________________________________________

Estimated total number of nodules >/= 1 cm: 1

Number of spongiform nodules >/=  2 cm not described below (TR1):

Number of mixed cystic and solid nodules >/= 1.5 cm not described
below (TR2): 0

_________________________________________________________

Nodule # 1:

Location: Right; Mid

Maximum size: 1 cm; Other 2 dimensions: 0.7 x 1 cm

Composition: solid/almost completely solid (2)

Echogenicity: hypoechoic (2)

Shape: not taller-than-wide (0)

Margins: ill-defined (0)

Echogenic foci: none (0)

ACR TI-RADS total points: 4.

ACR TI-RADS risk category: TR4 (4-6 points).

ACR TI-RADS recommendations:

*Given size (>/= 1 - 1.4 cm) and appearance, a follow-up ultrasound
in 1 year should be considered based on TI-RADS criteria.

_________________________________________________________

There is a small 0.7 cm nodule in the left thyroid gland that does
not meet criteria for follow-up or FNA.
IMPRESSION: 1. Normal sized thyroid gland.
2. Small 1 cm TR4 thyroid nodule in the right mid thyroid gland as
detailed above. A 1 year follow-up ultrasound is recommended for
this thyroid nodule.

The above is in keeping with the ACR TI-RADS recommendations - [HOSPITAL] 2718;[DATE].

## 2021-11-07 ENCOUNTER — Telehealth: Payer: Self-pay | Admitting: Internal Medicine

## 2021-11-07 NOTE — Telephone Encounter (Signed)
Patient advised and verbalized understanding 

## 2021-11-07 NOTE — Telephone Encounter (Signed)
PT is having fainting spells and wants to speak with someone concerning this. She can be reached at [phone].

## 2021-11-07 NOTE — Telephone Encounter (Signed)
Pt says she fainted yesterday around 2 pm she was sitting at the time. She started feeling very warm and slightly shaking. No current dizziness. Pt does not check her blood sugar. Pt says she feels like she drunk a lot of water and had just eaten before the fainting episode. Please advise

## 2021-11-15 ENCOUNTER — Other Ambulatory Visit: Payer: Self-pay

## 2021-11-15 ENCOUNTER — Encounter: Payer: Self-pay | Admitting: Cardiovascular Disease

## 2021-11-15 ENCOUNTER — Ambulatory Visit (INDEPENDENT_AMBULATORY_CARE_PROVIDER_SITE_OTHER): Payer: No Typology Code available for payment source

## 2021-11-15 ENCOUNTER — Ambulatory Visit (INDEPENDENT_AMBULATORY_CARE_PROVIDER_SITE_OTHER): Payer: No Typology Code available for payment source | Admitting: Cardiovascular Disease

## 2021-11-15 VITALS — BP 130/82 | HR 93 | Ht 61.0 in | Wt 234.0 lb

## 2021-11-15 DIAGNOSIS — R55 Syncope and collapse: Secondary | ICD-10-CM

## 2021-11-15 NOTE — Progress Notes (Unsigned)
Enrolled patient for a 7 Day Zio XT monitor to be mailed to patients home  

## 2021-11-15 NOTE — Progress Notes (Signed)
Cardiology Office Note:   Date:  11/15/2021  NAME:  Jacqueline Olson    MRN: [id number] DOB:  December 11, 2001   PCP:  Deatra James, MD  Cardiologist:  None  Electrophysiologist:  None   Referring MD: Deatra James, MD   Chief Complaint  Patient presents with   Loss of Consciousness        History of Present Illness:   Jacqueline Olson is a 20 y.o. female with a hx of obesity who is being seen today for the evaluation of syncope at the request of Deatra James, MD. She reports 2 syncopal events. She had one in the summer. She reports she was working at a [employer]. In the afternoon she was walking around the store. She reports feeling dizzy and light headed. She reports her heart was racing but had no CP or SOB. She passed out. She did not urinate or defecate on herself. No seizure like activity reported. She did have a HA. She came too quickly. She reports nothing to eat that day. She also reports poor water intake. She may drink 20-30 ounces daily. She takes metformin for pre-DM and recent A1c 6.1. TSH 1.09. She is not anemic with HGB 12.0. She had a second episode this past week. She reports she was sitting and getting her hair done. She reports poor water intake but she did have a light breakfast. She reports similar symptoms of light headedness and dizzy. She also describes a warm sensation that overcame her body. She passed out and had no CP or SOB. She did have racing heart. No urination/defecation on herself. She came too quickly. With both episodes she did not eat or drink well. She did check her blood sugar. She reports occasionally feeling her heart racing. Can occur daily. Only 2 syncopal episodes. No CP or SOB with normal activity. She is sedentary. No history of heart disease growing up. She reports her father sees a cardiologist but she unsure of the diagnosis. She had no heart conditions as a child. Her mother was on the phone during my exam. EKG normal. CVD exam normal. She does not  smoke, drink etoh or use drugs. Poor water intake reported. No excess caffeine. She is morbidly obese with BMI 44.   Past Medical History: Past Medical History:  Diagnosis Date   ACL tear 02/05/2016   right knee   Medial meniscus tear 02/05/2016   right knee   Migraines     Past Surgical History: Past Surgical History:  Procedure Laterality Date   CENTRAL VENOUS CATHETER INSERTION  2000-12-31   KNEE ARTHROSCOPY WITH ANTERIOR CRUCIATE LIGAMENT (ACL) REPAIR WITH HAMSTRING GRAFT Right 03/02/2016   Procedure: RIGHT KNEE ARTHROSCOPY,PARTIAL MEDIAL MENISECTOMY WITH ANTERIOR CRUCIATE LIGAMENT (ACL) REPAIR WITH HAMSTRING AUTOGRAFT;  Surgeon: Loreta Ave, MD;  Location: [facility];  Service: Orthopedics;  Laterality: Right;   KNEE ARTHROSCOPY WITH LATERAL MENISECTOMY Right 03/02/2016   Procedure: KNEE ARTHROSCOPY WITH PARTIAL LATERAL MENISECTOMY;  Surgeon: Loreta Ave, MD;  Location: [facility];  Service: Orthopedics;  Laterality: Right;    Current Medications: Current Meds  Medication Sig   cetirizine (ZYRTEC) 10 MG tablet Take 10 mg by mouth daily.   ibuprofen (ADVIL) 600 MG tablet Take 1 tablet (600 mg total) by mouth every 6 (six) hours as needed for moderate pain.   Investigational vitamin D 600 UNITS capsule SWOG S0812 Take 600 Units by mouth daily. Take with food.   Levonorgestrel-Ethinyl Estradiol (AMETHIA)  0.1-0.02 & 0.01 MG tablet Take 1 tablet by mouth daily.   Vitamin D, Ergocalciferol, (DRISDOL) 1.25 MG (50000 UNIT) CAPS capsule Take 1 capsule by mouth once a week.   [DISCONTINUED] metFORMIN (GLUCOPHAGE-XR) 500 MG 24 hr tablet Take 2 tablets (1,000 mg total) by mouth daily in the afternoon.     Allergies:    Patient has no known allergies.   Social History: Social History   Socioeconomic History   Marital status: Single    Spouse name: Not on file   Number of children: 0   Years of education: Not on file   Highest education level: Not  on file  Occupational History   Occupation: Hair stylist  Tobacco Use   Smoking status: Never   Smokeless tobacco: Never  Substance and Sexual Activity   Alcohol use: No   Drug use: No   Sexual activity: Not on file  Other Topics Concern   Not on file  Social History Narrative   Not on file   Social Determinants of Health   Financial Resource Strain: Not on file  Food Insecurity: Not on file  Transportation Needs: Not on file  Physical Activity: Not on file  Stress: Not on file  Social Connections: Not on file    Family History: The patient's family history includes Diabetes in her father and paternal grandfather; Hypertension in her father, mother, and paternal grandfather; Kidney disease in her paternal grandfather; Stroke in her paternal grandfather.  ROS:   All other ROS reviewed and negative. Pertinent positives noted in the HPI.     EKGs/Labs/Other Studies Reviewed:   The following studies were personally reviewed by me today:  EKG:  EKG is ordered today.  The ekg ordered today demonstrates normal sinus rhythm heart rate 93, nonspecific ST-T changes, and was personally reviewed by me.   Recent Labs: 03/21/2021: TSH 0.93 09/20/2021: ALT 10; BUN 9; Creatinine, Ser 0.73; Hemoglobin 12.3; Platelets 422; Potassium 4.2; Sodium 139   Recent Lipid Panel    Component Value Date/Time   CHOL 187 03/21/2021 1014   TRIG 48.0 03/21/2021 1014   HDL 55.80 03/21/2021 1014   CHOLHDL 3 03/21/2021 1014   VLDL 9.6 03/21/2021 1014   LDLCALC 122 (H) 03/21/2021 1014    Physical Exam:   VS:  BP 130/82   Pulse 93   Ht  (1.549 m)   Wt 234 lb (106.1 kg)   SpO2 99%   BMI 44.21 kg/m    Wt Readings from Last 3 Encounters:  11/15/21 234 lb (106.1 kg)  09/22/21 233 lb (105.7 kg)  09/12/21 232 lb (105.2 kg)    General: Well nourished, well developed, in no acute distress Head: Atraumatic, normal size  Eyes: PEERLA, EOMI  Neck: Supple, no JVD Endocrine: No  thryomegaly Cardiac: Normal S1, S2; RRR; no murmurs, rubs, or gallops Lungs: Clear to auscultation bilaterally, no wheezing, rhonchi or rales  Abd: Soft, nontender, no hepatomegaly  Ext: No edema, pulses 2+ Musculoskeletal: No deformities, BUE and BLE strength normal and equal Skin: Warm and dry, no rashes   Neuro: Alert and oriented to person, place, time, and situation, CNII-XII grossly intact, no focal deficits  Psych: Normal mood and affect   ASSESSMENT:   TARALEE MARCUS is a 20 y.o. female who presents for the following: 1. Vasovagal syncope     PLAN:   1. Vasovagal syncope -she has had 2 episodes of syncope. Episodes sound vasovagal per description. No red flag symptoms. No CP  or SOB. No history of SCD in family per her report. EKG normal and CVD exam normal.  -TSH normal. HGB normal A1c 6.1. Cr 0.62. -I am concerned with poor oral intake (20-30 ounces water daily) that this is contributing. She was instructed to drink 60-80 ounces water daily.  -Her metformin is also bothersome. She reports not eating much during the day on both occasions. Symptoms could also be related to hypoglycemia. I have recommended she stop this for now. She should diet and exercise to avoid DM.  -she did have palpitations but I feel this is secondary to a vasovagal episode. We will check a 7 day zio to exclude arrhythmia as she does report palpitations at other times.  -Her EKG is normal but we will obtain an echocardiogram to ensure her heart is normal.  -Given benign nature of syncope, she cannot drive for 1 month. As long as echo is normal and monitor is normal, I will release her to drive. I assume with water consumption, regular meals, and stopping metformin will help reduce her chance of having further episodes.  -the patient and her mother were in agreement with the above plan.   Disposition: Return in about 3 months (around 02/13/2022).  Medication Adjustments/Labs and Tests Ordered: Current  medicines are reviewed at length with the patient today.  Concerns regarding medicines are outlined above.  Orders Placed This Encounter  Procedures   LONG TERM MONITOR (3-14 DAYS)   EKG 12-Lead   ECHOCARDIOGRAM COMPLETE   No orders of the defined types were placed in this encounter.   Patient Instructions  Medication Instructions:  STOP Metformin   *If you need a refill on your cardiac medications before your next appointment, please call your pharmacy*  Testing/Procedures:  Echocardiogram - Your physician has requested that you have an echocardiogram. Echocardiography is a painless test that uses sound waves to create images of your heart. It provides your doctor with information about the size and shape of your heart and how well your heart's chambers and valves are working. This procedure takes approximately one hour. There are no restrictions for this procedure. This will be performed at either our [facility] location - [address]  -or- [facility] location [facility] 2nd floor.   ZIO XT- Long Term Monitor Instructions  Your physician has requested you wear a ZIO patch monitor for 7 days.  This is a single patch monitor. [employer] supplies one patch monitor per enrollment. Additional stickers are not available. Please do not apply patch if you will be having a Nuclear Stress Test,  Echocardiogram, Cardiac CT, MRI, or Chest Xray during the period you would be wearing the  monitor. The patch cannot be worn during these tests. You cannot remove and re-apply the  ZIO XT patch monitor.  Your ZIO patch monitor will be mailed 3 day USPS to your address on file. It may take 3-5 days  to receive your monitor after you have been enrolled.  Once you have received your monitor, please review the enclosed instructions. Your monitor  has already been registered assigning a specific monitor serial # to you.  Billing and Patient Assistance Program Information  We  have supplied [employer] with any of your insurance information on file for billing purposes. [employer] offers a sliding scale Patient Assistance Program for patients that do not have  insurance, or whose insurance does not completely cover the cost of the ZIO monitor.  You must apply for the Patient  Assistance Program to qualify for this discounted rate.  To apply, please call [employer] at [phone], select option 4, select option 2, ask to apply for  Patient Assistance Program. Meredeth Ide will ask your household income, and how many people  are in your household. They will quote your out-of-pocket cost based on that information.  [employer] will also be able to set up a 82-month, interest-free payment plan if needed.  Applying the monitor   Shave hair from upper left chest.  Hold abrader disc by orange tab. Rub abrader in 40 strokes over the upper left chest as  indicated in your monitor instructions.  Clean area with 4 enclosed alcohol pads. Let dry.  Apply patch as indicated in monitor instructions. Patch will be placed under collarbone on left  side of chest with arrow pointing upward.  Rub patch adhesive wings for 2 minutes. Remove white label marked "1". Remove the white  label marked "2". Rub patch adhesive wings for 2 additional minutes.  While looking in a mirror, press and release button in center of patch. A small green light will  flash 3-4 times. This will be your only indicator that the monitor has been turned on.  Do not shower for the first 24 hours. You may shower after the first 24 hours.  Press the button if you feel a symptom. You will hear a small click. Record Date, Time and  Symptom in the Patient Logbook.  When you are ready to remove the patch, follow instructions on the last 2 pages of Patient  Logbook. Stick patch monitor onto the last page of Patient Logbook.  Place Patient Logbook in the blue and white box. Use locking tab on box and tape box closed  securely. The blue  and white box has prepaid postage on it. Please place it in the mailbox as  soon as possible. Your physician should have your test results approximately 7 days after the  monitor has been mailed back to [facility].  Call [facility] Customer Care at [phone] if you have questions regarding  your ZIO XT patch monitor. Call them immediately if you see an orange light blinking on your  monitor.  If your monitor falls off in less than 4 days, contact our Monitor department at [phone].  If your monitor becomes loose or falls off after 4 days call [employer] at [phone] for  suggestions on securing your monitor    Follow-Up: At [facility], you and your health needs are our priority.  As part of our continuing mission to provide you with exceptional heart care, we have created designated Provider Care Teams.  These Care Teams include your primary Cardiologist (physician) and Advanced Practice Providers (APPs -  Physician Assistants and Nurse Practitioners) who all work together to provide you with the care you need, when you need it.  We recommend signing up for the patient portal called "MyChart".  Sign up information is provided on this After Visit Summary.  MyChart is used to connect with patients for Virtual Visits (Telemedicine).  Patients are able to view lab/test results, encounter notes, upcoming appointments, etc.  Non-urgent messages can be sent to your provider as well.   To learn more about what you can do with MyChart, go to [email or web address].    Your next appointment:   March 22 @ 9:40 AM  The format for your next appointment:   In Person  Provider:   Lennie Odor, MD       Signed, Lenna Gilford. O'Neal,  MD, [facility]  [facility]  [address] [place], [place] [address] [phone]  11/15/2021 5:59 PM

## 2021-11-15 NOTE — Patient Instructions (Addendum)
Medication Instructions:  STOP Metformin   *If you need a refill on your cardiac medications before your next appointment, please call your pharmacy*  Testing/Procedures:  Echocardiogram - Your physician has requested that you have an echocardiogram. Echocardiography is a painless test that uses sound waves to create images of your heart. It provides your doctor with information about the size and shape of your heart and how well your heart's chambers and valves are working. This procedure takes approximately one hour. There are no restrictions for this procedure. This will be performed at either our [facility] location - [address]  -or- [facility] location [facility] 2nd floor.   ZIO XT- Long Term Monitor Instructions  Your physician has requested you wear a ZIO patch monitor for 7 days.  This is a single patch monitor. [employer] supplies one patch monitor per enrollment. Additional stickers are not available. Please do not apply patch if you will be having a Nuclear Stress Test,  Echocardiogram, Cardiac CT, MRI, or Chest Xray during the period you would be wearing the  monitor. The patch cannot be worn during these tests. You cannot remove and re-apply the  ZIO XT patch monitor.  Your ZIO patch monitor will be mailed 3 day USPS to your address on file. It may take 3-5 days  to receive your monitor after you have been enrolled.  Once you have received your monitor, please review the enclosed instructions. Your monitor  has already been registered assigning a specific monitor serial # to you.  Billing and Patient Assistance Program Information  We have supplied [employer] with any of your insurance information on file for billing purposes. [employer] offers a sliding scale Patient Assistance Program for patients that do not have  insurance, or whose insurance does not completely cover the cost of the ZIO monitor.  You must apply for the Patient Assistance Program to  qualify for this discounted rate.  To apply, please call [employer] at [phone], select option 4, select option 2, ask to apply for  Patient Assistance Program. Meredeth Ide will ask your household income, and how many people  are in your household. They will quote your out-of-pocket cost based on that information.  [employer] will also be able to set up a 17-month, interest-free payment plan if needed.  Applying the monitor   Shave hair from upper left chest.  Hold abrader disc by orange tab. Rub abrader in 40 strokes over the upper left chest as  indicated in your monitor instructions.  Clean area with 4 enclosed alcohol pads. Let dry.  Apply patch as indicated in monitor instructions. Patch will be placed under collarbone on left  side of chest with arrow pointing upward.  Rub patch adhesive wings for 2 minutes. Remove white label marked "1". Remove the white  label marked "2". Rub patch adhesive wings for 2 additional minutes.  While looking in a mirror, press and release button in center of patch. A small green light will  flash 3-4 times. This will be your only indicator that the monitor has been turned on.  Do not shower for the first 24 hours. You may shower after the first 24 hours.  Press the button if you feel a symptom. You will hear a small click. Record Date, Time and  Symptom in the Patient Logbook.  When you are ready to remove the patch, follow instructions on the last 2 pages of Patient  Logbook. Stick patch monitor onto the last page  of Patient Logbook.  Place Patient Logbook in the blue and white box. Use locking tab on box and tape box closed  securely. The blue and white box has prepaid postage on it. Please place it in the mailbox as  soon as possible. Your physician should have your test results approximately 7 days after the  monitor has been mailed back to [facility].  Call [facility] Customer Care at [phone] if you have questions regarding  your ZIO XT  patch monitor. Call them immediately if you see an orange light blinking on your  monitor.  If your monitor falls off in less than 4 days, contact our Monitor department at [phone].  If your monitor becomes loose or falls off after 4 days call [employer] at [phone] for  suggestions on securing your monitor    Follow-Up: At [facility], you and your health needs are our priority.  As part of our continuing mission to provide you with exceptional heart care, we have created designated Provider Care Teams.  These Care Teams include your primary Cardiologist (physician) and Advanced Practice Providers (APPs -  Physician Assistants and Nurse Practitioners) who all work together to provide you with the care you need, when you need it.  We recommend signing up for the patient portal called "MyChart".  Sign up information is provided on this After Visit Summary.  MyChart is used to connect with patients for Virtual Visits (Telemedicine).  Patients are able to view lab/test results, encounter notes, upcoming appointments, etc.  Non-urgent messages can be sent to your provider as well.   To learn more about what you can do with MyChart, go to [email or web address].    Your next appointment:   March 22 @ 9:40 AM  The format for your next appointment:   In Person  Provider:   Lennie Odor, MD

## 2021-11-18 ENCOUNTER — Ambulatory Visit (HOSPITAL_COMMUNITY): Payer: No Typology Code available for payment source | Attending: Internal Medicine

## 2021-11-18 ENCOUNTER — Other Ambulatory Visit: Payer: Self-pay

## 2021-11-18 DIAGNOSIS — R55 Syncope and collapse: Secondary | ICD-10-CM | POA: Diagnosis present

## 2021-11-18 LAB — ECHOCARDIOGRAM COMPLETE
Area-P 1/2: 5.23 cm2
S' Lateral: 2.9 cm

## 2021-11-19 DIAGNOSIS — R55 Syncope and collapse: Secondary | ICD-10-CM | POA: Diagnosis not present

## 2022-01-11 ENCOUNTER — Encounter: Payer: Self-pay | Admitting: *Deleted

## 2022-01-13 ENCOUNTER — Ambulatory Visit (INDEPENDENT_AMBULATORY_CARE_PROVIDER_SITE_OTHER): Payer: No Typology Code available for payment source | Admitting: Psychiatry

## 2022-01-13 ENCOUNTER — Encounter: Payer: Self-pay | Admitting: Psychiatry

## 2022-01-13 VITALS — BP 132/82 | HR 99 | Ht 61.0 in | Wt 234.0 lb

## 2022-01-13 DIAGNOSIS — R202 Paresthesia of skin: Secondary | ICD-10-CM

## 2022-01-13 DIAGNOSIS — G43009 Migraine without aura, not intractable, without status migrainosus: Secondary | ICD-10-CM

## 2022-01-13 DIAGNOSIS — R200 Anesthesia of skin: Secondary | ICD-10-CM | POA: Diagnosis not present

## 2022-01-13 MED ORDER — RIZATRIPTAN BENZOATE 10 MG PO TABS: 10.0000 mg | ORAL_TABLET | ORAL | 3 refills | Status: DC | PRN

## 2022-01-13 MED ORDER — TOPIRAMATE 25 MG PO TABS: ORAL_TABLET | ORAL | 3 refills | Status: DC

## 2022-01-13 NOTE — Patient Instructions (Addendum)
Start Topamax for headache prevention. Take 25 mg (1 pill) at bedtime for one week, then increase to 50 mg (2 pills) at bedtime for one week, then take 75 mg (3 pills) at bedtime for one week, then take 100 mg (4 pills) at bedtime Start Maxalt (rizatriptan) as needed for migraines. Take at the onset of migraine. If headache recurs or does not fully resolve, you may take a second dose after 2 hours. Please avoid taking more than 2 days per week MRI brain

## 2022-01-13 NOTE — Progress Notes (Signed)
Referring:  Deatra James, MD [phone] [address] [place],  [place] [address]  PCP: Deatra James, MD  Neurology was asked to evaluate Jacqueline Olson, a 21 year old female for a chief complaint of headaches.  Our recommendations of care will be communicated by shared medical record.    CC:  headaches  HPI:  Medical co-morbidities: seasonal allergies  The patient presents for evaluation of migraines which have been present since she was a child. They have become more frequent lately. She currently experiences 8 migraines per month. Migraines are described as frontal and occipital throbbing with associated photophobia, phonophobia, and nausea. Has a lower level headache the rest of the days of the month.   Takes ibuprofen which does not help much. Takes OTCs 3 times per week.  She has been getting dizzy and passing out for the past few months. These episodes are sometimes, but not always associated with her headaches. Endorses both lightheadedness and vertigo.  Headache History: Onset: childhood Triggers: none Aura: none Location: bifrontal, occiput Quality/Description: throbbing, pressure Associated Symptoms:  Photophobia: yes  Phonophobia: yes  Nausea: yes Other symptoms: dizziness Worse with activity?: yes Duration of headaches: up to 24 hours  Pregnancy planning/birth control: OCPs  Headache days per month: 30 Headache free days per month: 0  Current Treatment: Abortive ibuprofen  Preventative none  Prior Therapies                                 ibuprofen   Headache Risk Factors: Headache risk factors and/or co-morbidities (+) Neck Pain  LABS: CBC    Component Value Date/Time   WBC 7.1 09/20/2021 1011   RBC 4.38 09/20/2021 1011   HGB 12.3 09/20/2021 1011   HCT 35.9 (L) 09/20/2021 1011   PLT 422 (H) 09/20/2021 1011   MCV 82.0 09/20/2021 1011   MCH 28.1 09/20/2021 1011   MCHC 34.3 09/20/2021 1011   RDW 13.2 09/20/2021 1011   LYMPHSABS 1.6  09/20/2021 1011   MONOABS 0.4 09/20/2021 1011   EOSABS 0.1 09/20/2021 1011   BASOSABS 0.0 09/20/2021 1011   CMP Latest Ref Rng & Units 09/20/2021 09/12/2021 05/12/2021  Glucose 70 - 99 mg/dL 680(H) 212(Y) 89  BUN 6 - 20 mg/dL 9 7 10   Creatinine 0.44 - 1.00 mg/dL 4.82 5.00  Sodium 135 - 145 mmol/L 139 141 141  Potassium 3.5 - 5.1 mmol/L 4.2 4.2 4.2  Chloride 98 - 111 mmol/L 106 106 106  CO2 22 - 32 mmol/L 23 26 24   Calcium 8.9 - 10.3 mg/dL 9.2 9.1 9.1  Total Protein 6.5 - 8.1 g/dL 7.0 6.6 6.9  Total Bilirubin 0.3 - 1.2 mg/dL 0.4 0.3 0.3  Alkaline Phos 38 - 126 U/L 74 67 67  AST 15 - 41 U/L 12(L) 10(L) 9(L)  ALT 0 - 44 U/L 10 10 7      IMAGING:  none   Current Outpatient Medications on File Prior to Visit  Medication Sig Dispense Refill   cetirizine (ZYRTEC) 10 MG tablet Take 10 mg by mouth daily.     ibuprofen (ADVIL) 600 MG tablet Take 1 tablet (600 mg total) by mouth every 6 (six) hours as needed for moderate pain. 30 tablet 0   Investigational vitamin D 600 UNITS capsule SWOG S0812 Take 600 Units by mouth daily. Take with food.     Levonorgestrel-Ethinyl Estradiol (AMETHIA) 0.1-0.02 & 0.01 MG tablet Take 1 tablet  by mouth daily. 84 tablet 3   Vitamin D, Ergocalciferol, (DRISDOL) 1.25 MG (50000 UNIT) CAPS capsule Take 1 capsule by mouth once a week.     No current facility-administered medications on file prior to visit.     Allergies: No Known Allergies  Family History: Migraine or other headaches in the family:  brother Aneurysms in a first degree relative:  no Brain tumors in the family:  no Other neurological illness in the family:   no  Past Medical History: Past Medical History:  Diagnosis Date   ACL tear 02/05/2016   right knee   Medial meniscus tear 02/05/2016   right knee   Migraines    Vasovagal syncope    Vitamin D deficiency     Past Surgical History Past Surgical History:  Procedure Laterality Date   CENTRAL VENOUS CATHETER INSERTION   05/10/01   KNEE ARTHROSCOPY WITH ANTERIOR CRUCIATE LIGAMENT (ACL) REPAIR WITH HAMSTRING GRAFT Right 03/02/2016   Procedure: RIGHT KNEE ARTHROSCOPY,PARTIAL MEDIAL MENISECTOMY WITH ANTERIOR CRUCIATE LIGAMENT (ACL) REPAIR WITH HAMSTRING AUTOGRAFT;  Surgeon: Loreta Ave, MD;  Location: [facility];  Service: Orthopedics;  Laterality: Right;   KNEE ARTHROSCOPY WITH LATERAL MENISECTOMY Right 03/02/2016   Procedure: KNEE ARTHROSCOPY WITH PARTIAL LATERAL MENISECTOMY;  Surgeon: Loreta Ave, MD;  Location: [facility];  Service: Orthopedics;  Laterality: Right;    Social History: Social History   Tobacco Use   Smoking status: Never   Smokeless tobacco: Never  Substance Use Topics   Alcohol use: No   Drug use: No    ROS: Negative for fevers, chills. Positive for headaches. All other systems reviewed and negative unless stated otherwise in HPI.   Physical Exam:   Vital Signs: BP 132/82    Pulse 99    Ht 5\' 1"  (1.549 m)    Wt 234 lb (106.1 kg)    BMI 44.21 kg/m  GENERAL: well appearing,in no acute distress,alert SKIN:  Color, texture, turgor normal. No rashes or lesions HEAD:  Normocephalic/atraumatic. CV:  RRR RESP: Normal respiratory effort MSK: +tenderness to palpation over right occiput, neck, and shoulders  NEUROLOGICAL: Mental Status: Alert, oriented to person, place and time,Follows commands Cranial Nerves: PERRL,visual fields intact to confrontation,extraocular movements intact, diminished sensation right V1,no facial droop or ptosis,hearing grossly intact,no dysarthria Motor: muscle strength 5/5 both upper and lower extremities,no drift, normal tone Reflexes: 2+ throughout Sensation: intact to light touch all 4 extremities Coordination: Finger-to- nose-finger intact bilaterally,Heel-to-shin intact bilaterally Gait: normal-based   IMPRESSION: 21 year old female who presents for evaluation of headaches. Her current headache pattern is  consistent with chronic migraine. Neurological exam significant for decreased sensation over right V1. Will order MRI brain to assess for structural causes of worsening headaches and sensation changes. Will start Topamax for headache prevention and Maxalt for rescue.  PLAN: -MRI brain with contrast -Prevention: Start Topamax  5 mg once daily; increase dose by 25 mg each week as tolerated -- up to 100 mg/day -Rescue: Start Maxalt 10 mg PRN -Next steps: consider neck PT, propranolol, SNRI   I spent a total of 18 minutes chart reviewing and counseling the patient. Headache education was done. Discussed treatment options including preventive and acute medications. Discussed medication overuse headache and to limit use of acute treatments to no more than 2 days/week or 10 days/month. Discussed medication side effects, adverse reactions and drug interactions. Written educational materials and patient instructions outlining all of the above were given.  Follow-up: 3  months   Ocie DoyneJennifer Austyn Perriello, MD 01/13/2022   8:40 AM

## 2022-01-17 ENCOUNTER — Telehealth: Payer: Self-pay | Admitting: Psychiatry

## 2022-01-17 NOTE — Telephone Encounter (Signed)
[facility] community auth: [id number] (exp. 01/17/22 to 03/03/22) & LVM for pt to call back to call back to give me the phone number of the back of her [facility] card

## 2022-02-14 NOTE — Telephone Encounter (Signed)
[employer] Bind auth: NPR Spoke to May ref # [id number] order sent to [facility], they will reach out to the patient to schedule.  ?

## 2022-02-27 ENCOUNTER — Ambulatory Visit
Admission: RE | Admit: 2022-02-27 | Discharge: 2022-02-27 | Disposition: A | Discharge status: Home / Self Care | Payer: No Typology Code available for payment source | Source: Ambulatory Visit | Attending: Psychiatry | Admitting: Psychiatry

## 2022-02-27 ENCOUNTER — Other Ambulatory Visit: Payer: Self-pay

## 2022-02-27 DIAGNOSIS — R202 Paresthesia of skin: Secondary | ICD-10-CM | POA: Diagnosis not present

## 2022-02-27 DIAGNOSIS — R200 Anesthesia of skin: Secondary | ICD-10-CM | POA: Diagnosis not present

## 2022-02-27 MED ORDER — GADOBENATE DIMEGLUMINE 529 MG/ML IV SOLN
20.0000 mL | Freq: Once | INTRAVENOUS | Status: AC | PRN
  Administered 2022-02-27: 20 mL via INTRAVENOUS

## 2022-02-27 NOTE — Progress Notes (Deleted)
?Cardiology Office Note:   ?Date:  02/27/2022  ?NAME:  Jacqueline Olson    ?MRN: [id number] ?DOB:  12-23-2000  ? ?PCP:  Deatra James, MD  ?Cardiologist:  None  ?Electrophysiologist:  None  ? ?Referring MD: Deatra James, MD  ? ?No chief complaint on file. ?*** ? ?History of Present Illness:   ?Jacqueline Olson is a 21 y.o. female with a hx of vasovagal syncope who presents for follow-up.  ? ?Problem List ?Migraine ?Vasovagal syncope  ? ?Past Medical History: ?Past Medical History:  ?Diagnosis Date  ? ACL tear 02/05/2016  ? right knee  ? Medial meniscus tear 02/05/2016  ? right knee  ? Migraines   ? Vasovagal syncope   ? Vitamin D deficiency   ? ? ?Past Surgical History: ?Past Surgical History:  ?Procedure Laterality Date  ? CENTRAL VENOUS CATHETER INSERTION  18-Dec-2000  ? KNEE ARTHROSCOPY WITH ANTERIOR CRUCIATE LIGAMENT (ACL) REPAIR WITH HAMSTRING GRAFT Right 03/02/2016  ? Procedure: RIGHT KNEE ARTHROSCOPY,PARTIAL MEDIAL MENISECTOMY WITH ANTERIOR CRUCIATE LIGAMENT (ACL) REPAIR WITH HAMSTRING AUTOGRAFT;  Surgeon: Loreta Ave, MD;  Location: [facility];  Service: Orthopedics;  Laterality: Right;  ? KNEE ARTHROSCOPY WITH LATERAL MENISECTOMY Right 03/02/2016  ? Procedure: KNEE ARTHROSCOPY WITH PARTIAL LATERAL MENISECTOMY;  Surgeon: Loreta Ave, MD;  Location: [facility];  Service: Orthopedics;  Laterality: Right;  ? ? ?Current Medications: ?No outpatient medications have been marked as taking for the 03/01/22 encounter (Appointment) with O'Neal, Ronnald Ramp, MD.  ?  ? ?Allergies:    ?Patient has no known allergies.  ? ?Social History: ?Social History  ? ?Socioeconomic History  ? Marital status: Single  ?  Spouse name: Not on file  ? Number of children: 0  ? Years of education: Not on file  ? Highest education level: Associate degree: occupational, Scientist, product/process development, or vocational program  ?Occupational History  ? Occupation: Hair stylist  ?Tobacco Use  ? Smoking status: Never  ? Smokeless tobacco:  Never  ?Substance and Sexual Activity  ? Alcohol use: No  ? Drug use: No  ? Sexual activity: Not on file  ?Other Topics Concern  ? Not on file  ?Social History Narrative  ? Lives with parents, siblings  ? coffee  ? ?Social Determinants of Health  ? ?Financial Resource Strain: Not on file  ?Food Insecurity: Not on file  ?Transportation Needs: Not on file  ?Physical Activity: Not on file  ?Stress: Not on file  ?Social Connections: Not on file  ?  ? ?Family History: ?The patient's ***family history includes Diabetes in her father and paternal grandfather; Hypertension in her father, mother, and paternal grandfather; Kidney disease in her paternal grandfather; Stroke in her paternal grandfather. ? ?ROS:   ?All other ROS reviewed and negative. Pertinent positives noted in the HPI.    ? ?EKGs/Labs/Other Studies Reviewed:   ?The following studies were personally reviewed by me today: ? ?EKG:  EKG is *** ordered today.  The ekg ordered today demonstrates ***, and was personally reviewed by me.  ? ?TTE 11/18/2021 ? ? 1. Global longitudinal strain is -20.1%. Left ventricular ejection  ?fraction, by estimation, is 55 to 60%. The left ventricle has normal  ?function. The left ventricle has no regional wall motion abnormalities.  ?Indeterminate diastolic filling due to E-A  ?fusion.  ? 2. Right ventricular systolic function is normal. The right ventricular  ?size is normal.  ? 3. The mitral valve is normal in structure. Trivial mitral valve  ?  regurgitation.  ? ?Recent Labs: ?03/21/2021: TSH 0.93 ?09/20/2021: ALT 10; BUN 9; Creatinine, Ser 0.73; Hemoglobin 12.3; Platelets 422; Potassium 4.2; Sodium 139  ? ?Recent Lipid Panel ?   ?Component Value Date/Time  ? CHOL 187 03/21/2021 1014  ? TRIG 48.0 03/21/2021 1014  ? HDL 55.80 03/21/2021 1014  ? CHOLHDL 3 03/21/2021 1014  ? VLDL 9.6 03/21/2021 1014  ? LDLCALC 122 (H) 03/21/2021 1014  ? ? ?Physical Exam:   ?VS:  There were no vitals taken for this visit.   ?Wt Readings from Last 3  Encounters:  ?01/13/22 234 lb (106.1 kg)  ?11/15/21 234 lb (106.1 kg)  ?09/22/21 233 lb (105.7 kg)  ?  ?General: Well nourished, well developed, in no acute distress ?Head: Atraumatic, normal size  ?Eyes: PEERLA, EOMI  ?Neck: Supple, no JVD ?Endocrine: No thryomegaly ?Cardiac: Normal S1, S2; RRR; no murmurs, rubs, or gallops ?Lungs: Clear to auscultation bilaterally, no wheezing, rhonchi or rales  ?Abd: Soft, nontender, no hepatomegaly  ?Ext: No edema, pulses 2+ ?Musculoskeletal: No deformities, BUE and BLE strength normal and equal ?Skin: Warm and dry, no rashes   ?Neuro: Alert and oriented to person, place, time, and situation, CNII-XII grossly intact, no focal deficits  ?Psych: Normal mood and affect  ? ?ASSESSMENT:   ?Jacqueline Olson is a 21 y.o. female who presents for the following: ?No diagnosis found. ? ?PLAN:   ?There are no diagnoses linked to this encounter. ? ?{Are you ordering a CV Procedure (e.g. stress test, cath, DCCV, TEE, etc)?   Press F2        :[id number]} ? ?Disposition: No follow-ups on file. ? ?Medication Adjustments/Labs and Tests Ordered: ?Current medicines are reviewed at length with the patient today.  Concerns regarding medicines are outlined above.  ?No orders of the defined types were placed in this encounter. ? ?No orders of the defined types were placed in this encounter. ? ? ?There are no Patient Instructions on file for this visit.  ? ?Time Spent with Patient: I have spent a total of *** minutes with patient reviewing hospital notes, telemetry, EKGs, labs and examining the patient as well as establishing an assessment and plan that was discussed with the patient.  > 50% of time was spent in direct patient care. ? ?Signed, ?Gerri Spore T. Flora Lipps, MD, [facility] ?[place]  [facility]  ?[address], [address] ?[place], [place] [address] ?[phone]  ?02/27/2022 10:41 AM    ? ?

## 2022-03-01 ENCOUNTER — Ambulatory Visit: Payer: No Typology Code available for payment source | Admitting: Cardiovascular Disease

## 2022-03-01 DIAGNOSIS — R55 Syncope and collapse: Secondary | ICD-10-CM

## 2022-03-24 ENCOUNTER — Encounter: Payer: Self-pay | Admitting: Internal Medicine

## 2022-03-24 ENCOUNTER — Ambulatory Visit (INDEPENDENT_AMBULATORY_CARE_PROVIDER_SITE_OTHER): Payer: No Typology Code available for payment source | Admitting: Internal Medicine

## 2022-03-24 VITALS — BP 126/88 | HR 86 | Ht 61.0 in | Wt 237.0 lb

## 2022-03-24 DIAGNOSIS — R7303 Prediabetes: Secondary | ICD-10-CM | POA: Diagnosis not present

## 2022-03-24 DIAGNOSIS — E010 Iodine-deficiency related diffuse (endemic) goiter: Secondary | ICD-10-CM

## 2022-03-24 DIAGNOSIS — Q991 46, XX true hermaphrodite: Secondary | ICD-10-CM | POA: Diagnosis not present

## 2022-03-24 LAB — POCT GLYCOSYLATED HEMOGLOBIN (HGB A1C): Hemoglobin A1C: 6.1 % — AB (ref 4.0–5.6)

## 2022-03-24 MED ORDER — LEVONORGEST-ETH ESTRAD 91-DAY 0.1-0.02 & 0.01 MG PO TABS: 1.0000 | ORAL_TABLET | Freq: Every day | ORAL | 3 refills | Status: DC

## 2022-03-24 NOTE — Progress Notes (Signed)
? ?Name: Jacqueline Olson  ?MRN/ DOB: [id number], 2001/04/25    ?Age/ Sex: 21 y.o., female   ? ? ?PCP: Donald Prose, MD   ?Reason for Endocrinology Evaluation: Premature Ovarian Failure  ?   ?Initial Endocrinology Clinic Visit: 11/17/2019  ? ? ?PATIENT IDENTIFIER: Jacqueline Olson is a 21 y.o., female with a past medical history of Prediabetes, obesity and premature ovarian failure  . She has followed with [facility] since 11/17/2019 for consultative assistance with management of her Premature ovarian failure.  ? ?HISTORICAL SUMMARY: The patient was first diagnosed with premature ovarian failure during evaluation of irregular menses in 04/2019 with an [place] of 58 mIU/mL and LH 34.3 mIU/mL  With an estradiol of 11.2 pg/mL. She was started on Combined OCP . 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  ? ?Per pt she had menarche at age 43. Her menstruations have been regular until the age of 70 when her periods have become irregular.  ?She was also started on metformin for pre-diabetes ? ?Graduated from high school,currently in cosmetology school  ?Father with diabetes.  ?Maternal aunt with thyroid disease.  ? ? 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 No evidence of Turner syndrome and it was determined no cardiology referral is needed and this deletion only causes premature ovarian failure . ? ?No Hx of premature ovarian failure in the family  ?SUBJECTIVE:  ? ? ?Today (03/24/2022):  Jacqueline Olson is here for a follow up on her diagnosis of premature ovarian failure, prediabetes and MNG ? ? ?Denies worsening local neck swelling, denies dysphagia  ?Denies nausea, vomiting and diarrhea  ?Has occasional hot flashes  ?She was having dizziness and fainting spells , and was asked to stop Metformin. NO evidence of hypoglycemia on meter.  ? ? ? ? ?COC's ? ? ?HISTORY:  ?Past Medical History:   ?Past Medical History:  ?Diagnosis Date  ? ACL tear 02/05/2016  ? right knee  ? Medial meniscus tear 02/05/2016  ? right knee  ? Migraines   ? Vasovagal syncope   ? Vitamin D deficiency   ? ?Past Surgical History:  ?Past Surgical History:  ?Procedure Laterality Date  ? CENTRAL VENOUS CATHETER INSERTION  Mar 16, 2001  ? KNEE ARTHROSCOPY WITH ANTERIOR CRUCIATE LIGAMENT (ACL) REPAIR WITH HAMSTRING GRAFT Right 03/02/2016  ? Procedure: RIGHT KNEE ARTHROSCOPY,PARTIAL MEDIAL MENISECTOMY WITH ANTERIOR CRUCIATE LIGAMENT (ACL) REPAIR WITH HAMSTRING AUTOGRAFT;  Surgeon: Ninetta Lights, MD;  Location: [facility];  Service: Orthopedics;  Laterality: Right;  ? KNEE ARTHROSCOPY WITH LATERAL MENISECTOMY Right 03/02/2016  ? Procedure: KNEE ARTHROSCOPY WITH PARTIAL LATERAL MENISECTOMY;  Surgeon: Ninetta Lights, MD;  Location: [facility];  Service: Orthopedics;  Laterality: Right;  ? ?Social History:  reports that she has never smoked. She has never used smokeless tobacco. She reports that she does not drink alcohol and does not use drugs. ?Family History:  ?Family History  ?Problem Relation Age of Onset  ? Hypertension Mother   ? Diabetes Father   ? Hypertension Father   ? Kidney disease Paternal Grandfather   ?     ESRD/dialysis  ? Hypertension Paternal Grandfather   ? Diabetes Paternal Grandfather   ? Stroke Paternal Grandfather   ? ? ? ?HOME MEDICATIONS: ?Allergies as of 03/24/2022   ?No Known Allergies ?  ? ?  ?Medication List  ?  ? ?  ?  Accurate as of March 24, 2022  3:14 PM. If you have any questions, ask your nurse or doctor.  ?  ?  ? ?  ? ?cetirizine 10 MG tablet ?Commonly known as: ZYRTEC ?Take 10 mg by mouth daily. ?  ?ibuprofen 600 MG tablet ?Commonly known as: ADVIL ?Take 1 tablet (600 mg total) by mouth every 6 (six) hours as needed for moderate pain. ?  ?Investigational vitamin D 600 UNITS capsule SWOG S0812 ?Take 600 Units by mouth daily. Take with food. ?  ?Levonorgestrel-Ethinyl Estradiol  0.1-0.02 & 0.01 MG tablet ?Commonly known as: AMETHIA ?Take 1 tablet by mouth daily. ?  ?rizatriptan 10 MG tablet ?Commonly known as: Maxalt ?Take 1 tablet (10 mg total) by mouth as needed for migraine. May repeat in 2 hours if needed ?  ?topiramate 25 MG tablet ?Commonly known as: TOPAMAX ?Take 25 mg (1 pill) at bedtime for one week, then increase to 50 mg (2 pills) at bedtime for one week, then take 75 mg (3 pills) at bedtime for one week, then take 100 mg (4 pills) at bedtime ?  ?Vitamin D (Ergocalciferol) 1.25 MG (50000 UNIT) Caps capsule ?Commonly known as: DRISDOL ?Take 1 capsule by mouth once a week. ?  ? ?  ? ? ? ? ?OBJECTIVE:  ? ?PHYSICAL EXAM: ?VS: BP 126/88 (BP Location: Left Arm, Patient Position: Sitting, Cuff Size: Normal)   Pulse 86   Ht 5' 1" (1.549 m)   Wt 237 lb (107.5 kg)   SpO2 99%   BMI 44.78 kg/m?   ? ?EXAM: ?General: Pt appears well and is in NAD ?Right thyroid enlargement appreciated, normal left lobe   ?Lungs: Clear with good BS bilat with no rales, rhonchi, or wheezes  ?Heart: Auscultation: RRR.  ?Abdomen: Normoactive bowel sounds, soft, nontender, without masses or organomegaly palpable  ?Extremities:  ?BL LE: No pretibial edema normal ROM and strength.  ?Mental Status: Judgment, insight: Intact ?Orientation: Oriented to time, place, and person ?Mood and affect: No depression, anxiety, or agitation  ? ? ? ?DATA REVIEWED: ? ? Latest Reference Range & Units 03/24/22 15:34  ?Sodium 134 - 144 mmol/L 142  ?Potassium 3.5 - 5.2 mmol/L 4.7  ?Chloride 96 - 106 mmol/L 103  ?CO2 20 - 29 mmol/L 26  ?Glucose 70 - 99 mg/dL 77  ?BUN 6 - 20 mg/dL 12  ?Creatinine 0.57 - 1.00 mg/dL 0.73  ?Calcium 8.7 - 10.2 mg/dL 9.5  ?BUN/Creatinine Ratio 9 - 23  16  ?eGFR >59 mL/min/1.73 120  ?Total CHOL/HDL Ratio 0.0 - 4.4 ratio 3.6  ?Cholesterol, Total 100 - 199 mg/dL 203 (H)  ?HDL Cholesterol >39 mg/dL 57  ?Triglycerides 0 - 149 mg/dL 68  ?VLDL Cholesterol Cal 5 - 40 mg/dL 12  ?LDL Chol Calc (NIH) 0 - 99 mg/dL 134  (H)  ? ? Latest Reference Range & Units 03/24/22 15:34  ?TSH 0.450 - 4.500 uIU/mL 0.990  ?T4,Free(Direct) 0.82 - 1.77 ng/dL 1.12  ? ?Thyroid ultrasound 03/27/2022 ? ?Normal sized and appearing thyroid without discrete nodule or mass, ?similar to the 09/2020 examination. ?  ? ? ?ASSESSMENT / PLAN / RECOMMENDATIONS:  ? 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 ? ?- Cytogenetic analysis revealed an X chromosome with a partial long arm deletion in all cells examined. Deletions of the X long arm have correlated with premature menopause or gonadal dysgenesis.  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 No evidence of Turner syndrome and it was determined no cardiology referral is needed and this deletion only causes premature ovarian failure . ? ?- She is tolerating COC's without side effects  ? ? ?Medications : ? ?Continue Amethia 0.1-0.02 daily  ? ?  ? ? ?2. Pre-Diabetes :  ? ?- A1c stable at 6.1% ?-She has stopped the metformin because she was attributing her dizziness to it, there is no hypoglycemia on glucose meter ?-Patient will remain off metformin at this time but we emphasized the importance of lifestyle changes with exercise and a low-carb diet ? ? ? ? ?3. Multinodular Goiter : ? ?- She is clinically and biochemically euthyroid  ?- No local neck symptoms, but right thyroid lobe asymmetry noted  ?- Repeat ultrasound 09/2020 did not show any evidence of thyroid nodules , will proceed with repeat thyroid ultrasound  ? ? ? ? ? ?F/U in 6 months  ? ?Signed electronically by: ?Abby Nena Jordan, MD ? ?[facility]  ?[facility] ?[address] ?[place], [place] [address] ?Phone: [phone] ?FAX:  [phone]  ? ? ? ? ?CC: ?Donald Prose, MD ?[address] ?[place] [address] ?Phone: [phone]  ?Fax: [phone] ? ? ?Return to Endocrinology clinic as below: ?Future Appointments  ?Date Time P

## 2022-03-25 LAB — BASIC METABOLIC PANEL
BUN/Creatinine Ratio: 16 (ref 9–23)
BUN: 12 mg/dL (ref 6–20)
CO2: 26 mmol/L (ref 20–29)
Calcium: 9.5 mg/dL (ref 8.7–10.2)
Chloride: 103 mmol/L (ref 96–106)
Creatinine, Ser: 0.73 mg/dL (ref 0.57–1.00)
Glucose: 77 mg/dL (ref 70–99)
Potassium: 4.7 mmol/L (ref 3.5–5.2)
Sodium: 142 mmol/L (ref 134–144)
eGFR: 120 mL/min/{1.73_m2} (ref >59)

## 2022-03-25 LAB — LIPID PANEL
Chol/HDL Ratio: 3.6 ratio (ref 0.0–4.4)
Cholesterol, Total: 203 mg/dL — ABNORMAL HIGH (ref 100–199)
HDL: 57 mg/dL (ref >39)
LDL Chol Calc (NIH): 134 mg/dL — ABNORMAL HIGH (ref 0–99)
Triglycerides: 68 mg/dL (ref 0–149)
VLDL Cholesterol Cal: 12 mg/dL (ref 5–40)

## 2022-03-25 LAB — TSH: TSH: 0.99 u[IU]/mL (ref 0.450–4.500)

## 2022-03-25 LAB — T4, FREE: Free T4: 1.12 ng/dL (ref 0.82–1.77)

## 2022-03-27 ENCOUNTER — Ambulatory Visit
Admission: RE | Admit: 2022-03-27 | Discharge: 2022-03-27 | Disposition: A | Discharge status: Home / Self Care | Payer: No Typology Code available for payment source | Source: Ambulatory Visit | Attending: Internal Medicine | Admitting: Internal Medicine

## 2022-03-27 DIAGNOSIS — E010 Iodine-deficiency related diffuse (endemic) goiter: Secondary | ICD-10-CM

## 2022-04-03 ENCOUNTER — Encounter (HOSPITAL_COMMUNITY): Payer: Self-pay | Admitting: Emergency Medicine

## 2022-04-03 ENCOUNTER — Ambulatory Visit (HOSPITAL_COMMUNITY)
Admission: EM | Admit: 2022-04-03 | Discharge: 2022-04-03 | Disposition: A | Discharge status: Home / Self Care | Payer: No Typology Code available for payment source | Attending: Internal Medicine | Admitting: Internal Medicine

## 2022-04-03 ENCOUNTER — Other Ambulatory Visit: Payer: Self-pay

## 2022-04-03 ENCOUNTER — Ambulatory Visit (INDEPENDENT_AMBULATORY_CARE_PROVIDER_SITE_OTHER): Payer: No Typology Code available for payment source

## 2022-04-03 DIAGNOSIS — S6990XA Unspecified injury of unspecified wrist, hand and finger(s), initial encounter: Secondary | ICD-10-CM | POA: Diagnosis not present

## 2022-04-03 DIAGNOSIS — M79644 Pain in right finger(s): Secondary | ICD-10-CM

## 2022-04-03 MED ORDER — NAPROXEN SODIUM 550 MG PO TABS: 550.0000 mg | ORAL_TABLET | Freq: Two times a day (BID) | ORAL | 0 refills | Status: DC

## 2022-04-03 NOTE — ED Triage Notes (Signed)
Reports crushing finger in car door on Saturday.  Right middle finger is swollen, painful, small hematoma at base of nail, at cuticle.   ?

## 2022-04-03 NOTE — ED Provider Notes (Signed)
?[facility] ? ? ? ?CSN: [id number] ?Arrival date & time: 04/03/22  1451 ? ? ?  ? ?History   ?Chief Complaint ?Chief Complaint  ?Patient presents with  ? Finger Injury  ? ? ?HPI ?Jacqueline Olson is a 21 y.o. female.  ? ?Patient presents with pain and swelling to the right middle finger for 2 days after crashing finger in a car door.  Associated intermittent tingling.  Limited range of motion, unable to fully flex.  Hematoma present at the nailbed.  Has attempted use ice which has been minimally helpful.   ? ?Past Medical History:  ?Diagnosis Date  ? ACL tear 02/05/2016  ? right knee  ? Medial meniscus tear 02/05/2016  ? right knee  ? Migraines   ? Vasovagal syncope   ? Vitamin D deficiency   ? ? ?Patient Active Problem List  ? Diagnosis Date Noted  ? Thrombocytosis 05/12/2021  ? 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 Class 3 severe obesity without serious comorbidity with body mass index (BMI) of 40.0 to 44.9 in adult [facility]) 11/17/2019  ? Thyromegaly 11/17/2019  ? Insulin resistance 04/20/2015  ? 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 Prediabetes 01/06/2015  ? Morbid obesity (HCC) 01/06/2015  ? Acanthosis nigricans, acquired 01/06/2015  ? Dyspepsia 01/06/2015  ? Essential hypertension, benign 01/06/2015  ? Goiter 01/06/2015  ? ? ?Past Surgical History:  ?Procedure Laterality Date  ? CENTRAL VENOUS CATHETER INSERTION  2001-10-17  ? KNEE ARTHROSCOPY WITH ANTERIOR CRUCIATE LIGAMENT (ACL) REPAIR WITH HAMSTRING GRAFT Right 03/02/2016  ? Procedure: RIGHT KNEE ARTHROSCOPY,PARTIAL MEDIAL MENISECTOMY WITH ANTERIOR CRUCIATE LIGAMENT (ACL) REPAIR WITH HAMSTRING AUTOGRAFT;  Surgeon: Loreta Ave, MD;  Location: [facility];  Service: Orthopedics;  Laterality: Right;  ? KNEE ARTHROSCOPY WITH LATERAL MENISECTOMY Right 03/02/2016  ? Procedure: KNEE ARTHROSCOPY WITH PARTIAL LATERAL MENISECTOMY;  Surgeon: Loreta Ave, MD;  Location: [facility];  Service:  Orthopedics;  Laterality: Right;  ? ? ?OB History   ?No obstetric history on file. ?  ? ? ? ?Home Medications   ? ?Prior to Admission medications   ?Medication Sig Start Date End Date Taking? Authorizing Provider  ?cetirizine (ZYRTEC) 10 MG tablet Take 10 mg by mouth daily. ?Patient not taking: Reported on 04/03/2022 04/19/21   [provider]  ?ibuprofen (ADVIL) 600 MG tablet Take 1 tablet (600 mg total) by mouth every 6 (six) hours as needed for moderate pain. 06/29/21   Merrilee Jansky, MD  ?Investigational vitamin D 600 UNITS capsule SWOG [phone] Take 600 Units by mouth daily. Take with food.    [provider]  ?Levonorgestrel-Ethinyl Estradiol (AMETHIA) 0.1-0.02 & 0.01 MG tablet Take 1 tablet by mouth daily. 03/24/22   Shamleffer, Konrad Dolores, MD  ?rizatriptan (MAXALT) 10 MG tablet Take 1 tablet (10 mg total) by mouth as needed for migraine. May repeat in 2 hours if needed ?Patient not taking: Reported on 04/03/2022 01/13/22   Ocie Doyne, MD  ?topiramate (TOPAMAX) 25 MG tablet Take 25 mg (1 pill) at bedtime for one week, then increase to 50 mg (2 pills) at bedtime for one week, then take 75 mg (3 pills) at bedtime for one week, then take 100 mg (4 pills) at bedtime ?Patient not taking: Reported on 04/03/2022 01/13/22   Ocie Doyne, MD  ?Vitamin D, Ergocalciferol, (DRISDOL) 1.25 MG (50000 UNIT) CAPS capsule Take 1 capsule by mouth once a week. 05/05/21   [provider]  ? ? ?  Family History ?Family History  ?Problem Relation Age of Onset  ? Hypertension Mother   ? Diabetes Father   ? Hypertension Father   ? Kidney disease Paternal Grandfather   ?     ESRD/dialysis  ? Hypertension Paternal Grandfather   ? Diabetes Paternal Grandfather   ? Stroke Paternal Grandfather   ? ? ?Social History ?Social History  ? ?Tobacco Use  ? Smoking status: Never  ? Smokeless tobacco: Never  ?Vaping Use  ? Vaping Use: Never used  ?Substance Use Topics  ? Alcohol use: No  ? Drug use: No   ? ? ? ?Allergies   ?Patient has no known allergies. ? ? ?Review of Systems ?Review of Systems ?Defer to HPI  ? ? ?Physical Exam ?Triage Vital Signs ?ED Triage Vitals  ?Enc Vitals Group  ?   BP 04/03/22 1554 136/82  ?   Pulse Rate 04/03/22 1554 89  ?   Resp 04/03/22 1554 20  ?   Temp 04/03/22 1554 98.5 ?F (36.9 ?C)  ?   Temp Source 04/03/22 1554 Oral  ?   SpO2 04/03/22 1554 100 %  ?   Weight --   ?   Height --   ?   Head Circumference --   ?   Peak Flow --   ?   Pain Score 04/03/22 1550 6  ?   Pain Loc --   ?   Pain Edu? --   ?   Excl. in GC? --   ? ?No data found. ? ?Updated Vital Signs ?BP 136/82 (BP Location: Left Arm) Comment (BP Location): large cuff  Pulse 89   Temp 98.5 ?F (36.9 ?C) (Oral)   Resp 20   SpO2 100%  ? ?Visual Acuity ?Right Eye Distance:   ?Left Eye Distance:   ?Bilateral Distance:   ? ?Right Eye Near:   ?Left Eye Near:    ?Bilateral Near:    ? ?Physical Exam ?Constitutional:   ?   Appearance: Normal appearance.  ?HENT:  ?   Head: Normocephalic.  ?Eyes:  ?   Extraocular Movements: Extraocular movements intact.  ?Pulmonary:  ?   Effort: Pulmonary effort is normal.  ?Musculoskeletal:  ?   Comments: Tenderness noted at the distal proximal phalanx, with no involvement of the joint, no swelling present, hematoma noted at the proximal nailbed, range of motion intact, decreased sensation, capillary refill less than 3  ?Neurological:  ?   Mental Status: She is alert and oriented to person, place, and time. Mental status is at baseline.  ?Psychiatric:     ?   Mood and Affect: Mood normal.     ?   Behavior: Behavior normal.  ? ? ? ?UC Treatments / Results  ?Labs ?(all labs ordered are listed, but only abnormal results are displayed) ?Labs Reviewed - No data to display ? ?EKG ? ? ?Radiology ?DG Finger Middle Right ? ?Result Date: 04/03/2022 ?CLINICAL DATA:  Trauma, pain and swelling EXAM: RIGHT MIDDLE FINGER 3 v COMPARISON:  None. FINDINGS: No recent fracture or dislocation is seen. There are no opaque  foreign bodies. IMPRESSION: No fracture or dislocation is seen in the right middle finger. Electronically Signed   By: Ernie Avena M.D.   On: 04/03/2022 16:10   ? ?Procedures ?Procedures (including critical care time) ? ?Medications Ordered in UC ?Medications - No data to display ? ?Initial Impression / Assessment and Plan / UC Course  ?I have reviewed the triage vital signs and the nursing  notes. ? ?Pertinent labs & imaging results that were available during my care of the patient were reviewed by me and considered in my medical decision making (see chart for details). ? ?Injury, initial encounter ? ?X-ray negative, discussed findings with patient, prescribed naproxen  twice daily for 5 days then as needed recommended ice and heat over the affected area in 10 to 15-minute intervals, may buddy tape the third and fourth finger together as needed for additional support, activity as tolerated, given walker for to orthopedics if symptoms continue to persist ?Final Clinical Impressions(s) / UC Diagnoses  ? ?Final diagnoses:  ?None  ? ?Discharge Instructions   ?None ?  ? ?ED Prescriptions   ?None ?  ? ?PDMP not reviewed this encounter. ?  ?Valinda HoarWhite, Kenora Spayd R, NP ?04/03/22 1648 ? ?

## 2022-04-03 NOTE — Discharge Instructions (Signed)
There was no injury to your bone of your right middle finger, symptoms should progressively get better with Tylenol ? ?Take naproxen twice a day for the next 7 days, this is to help reduce inflammation which in turn should help with your pain ? ?Use ice or heat over the affected area in 10 to 15-minute intervals ? ?Attempt to open the blood blister that is on your finger as this will increase risk for infection ? ?You may take your second and third finger together to add stability until symptoms have resolved ? ?You may continue activity as tolerated ?

## 2022-04-20 ENCOUNTER — Encounter: Payer: Self-pay | Admitting: Psychiatry

## 2022-04-20 ENCOUNTER — Ambulatory Visit (INDEPENDENT_AMBULATORY_CARE_PROVIDER_SITE_OTHER): Payer: 59 | Admitting: Psychiatry

## 2022-04-20 VITALS — BP 138/87 | HR 100 | Ht 61.0 in | Wt 237.0 lb

## 2022-04-20 DIAGNOSIS — M542 Cervicalgia: Secondary | ICD-10-CM

## 2022-04-20 DIAGNOSIS — G43009 Migraine without aura, not intractable, without status migrainosus: Secondary | ICD-10-CM

## 2022-04-20 MED ORDER — TOPIRAMATE 50 MG PO TABS: ORAL_TABLET | ORAL | 3 refills | Status: DC

## 2022-04-20 MED ORDER — NARATRIPTAN HCL 2.5 MG PO TABS: 2.5000 mg | ORAL_TABLET | ORAL | 3 refills | Status: DC | PRN

## 2022-04-20 NOTE — Progress Notes (Signed)
? ?  CC:  headaches ? ?Follow-up Visit ? ?Last visit: 01/13/22 ? ?Brief HPI: ?21 year old female who follows in clinic for migraines. ? ?At her last visit MRI brain was ordered. She was started on Topamax for migraine prevention and Maxalt for rescue. ? ?Interval History: ?Since her last visit her headaches have been the same. She continues to have migraines ~8 days per month with lower level headaches every day. She reports significant neck pain as well. Maxalt reduced her headache but caused chest and throat tightness. She took Topamax for 3 weeks, but states she got confused on the dosage and stopped after that. She did not have side effects with Topamax.  ? ?Brain MRI 02/27/22 was unremarkable. ? ?Headache days per month: 30 ?Headache free days per month: 0 ? ?Current Headache Regimen: ?Preventative: none ?Abortive: ibuprofen ? ?Prior Therapies                                  ?Ibuprofen ?Topamax 100 mg QHS ?Maxalt 10 mg PRN - chest and throat tightness ? ?Physical Exam:  ? ?Vital Signs: ?BP 138/87   Pulse 100   Ht 5\' 1"  (1.549 m)   Wt 237 lb (107.5 kg)   BMI 44.78 kg/m?  ?GENERAL:  well appearing, in no acute distress, alert  ?SKIN:  Color, texture, turgor normal. No rashes or lesions ?HEAD:  Normocephalic/atraumatic. ?RESP: normal respiratory effort ?MSK:  +tenderness to palpation over bilateral neck ? ?NEUROLOGICAL: ?Mental Status: Alert, oriented to person, place and time, Follows commands, and Speech fluent and appropriate. ?Cranial Nerves: PERRL, face symmetric, no dysarthria, hearing grossly intact ?Motor: moves all extremities equally ?Gait: normal-based. ? ?IMPRESSION: ?21 year old female who presents for follow up of migraines. She was not able to tolerate Maxalt. Will start naratriptan for rescue. Will restart Topamax with simplified titration schedule (50 mg QHS for one week, then increase to 100 mg QHS). Referral to neck PT placed for cervicalgia. ? ?PLAN: ?-Preventive: Restart Topamax. Take 50 mg  QHS x1 week, then increase to 100 mg QHS ?-Rescue: Start naratriptan 2.5 mg PRN ?-Referral to neck PT for cervicalgia ?-Next steps: consider gepant for rescue, consider SNRI or propranolol for prevention ? ? ?Follow-up: 4 months ? ?I spent a total of 21 minutes on the date of the service. Discussed treatment options including preventive and acute medications.  Discussed medication side effects, adverse reactions and drug interactions. Written educational materials and patient instructions outlining all of the above were given. ? ?36, MD ?04/20/22 ?3:42 PM ? ?

## 2022-04-20 NOTE — Patient Instructions (Signed)
Referral to physical therapy for the neck ?Start naratriptan as needed for migraine. Take at the onset of migraine. If headache recurs or does not fully resolve, you may take a second dose after 2 hours. Please avoid taking more than 2 days per week or 10 days per month. ?2. Take Topamax 50 mg (1 pill) at bedtime for one week then increase to 100 mg (2 pills) at bedtime ?

## 2022-05-08 NOTE — Progress Notes (Deleted)
Cardiology Office Note:   Date:  05/08/2022  NAME:  Jacqueline Olson    MRN: [id number] DOB:  Apr 21, 2001   PCP:  Deatra James, MD  Cardiologist:  None  Electrophysiologist:  None   Referring MD: Deatra James, MD   No chief complaint on file. ***  History of Present Illness:   Jacqueline Olson is a 21 y.o. female with a hx of vasovagal syncope who presents for follow-up.   Past Medical History: Past Medical History:  Diagnosis Date   ACL tear 02/05/2016   right knee   Medial meniscus tear 02/05/2016   right knee   Migraines    Vasovagal syncope    Vitamin D deficiency     Past Surgical History: Past Surgical History:  Procedure Laterality Date   CENTRAL VENOUS CATHETER INSERTION  May 04, 2001   KNEE ARTHROSCOPY WITH ANTERIOR CRUCIATE LIGAMENT (ACL) REPAIR WITH HAMSTRING GRAFT Right 03/02/2016   Procedure: RIGHT KNEE ARTHROSCOPY,PARTIAL MEDIAL MENISECTOMY WITH ANTERIOR CRUCIATE LIGAMENT (ACL) REPAIR WITH HAMSTRING AUTOGRAFT;  Surgeon: Loreta Ave, MD;  Location: [facility];  Service: Orthopedics;  Laterality: Right;   KNEE ARTHROSCOPY WITH LATERAL MENISECTOMY Right 03/02/2016   Procedure: KNEE ARTHROSCOPY WITH PARTIAL LATERAL MENISECTOMY;  Surgeon: Loreta Ave, MD;  Location: [facility];  Service: Orthopedics;  Laterality: Right;    Current Medications: No outpatient medications have been marked as taking for the 05/11/22 encounter (Appointment) with O'Neal, Ronnald Ramp, MD.     Allergies:    Patient has no known allergies.   Social History: Social History   Socioeconomic History   Marital status: Single    Spouse name: Not on file   Number of children: 0   Years of education: Not on file   Highest education level: Associate degree: occupational, Scientist, product/process development, or vocational program  Occupational History   Occupation: Hair stylist  Tobacco Use   Smoking status: Never   Smokeless tobacco: Never  Vaping Use   Vaping Use: Never used   Substance and Sexual Activity   Alcohol use: No   Drug use: No   Sexual activity: Not on file  Other Topics Concern   Not on file  Social History Narrative   Lives with parents, siblings   coffee   Social Determinants of Health   Financial Resource Strain: Not on file  Food Insecurity: Not on file  Transportation Needs: Not on file  Physical Activity: Not on file  Stress: Not on file  Social Connections: Not on file     Family History: The patient's ***family history includes Diabetes in her father and paternal grandfather; Hypertension in her father, mother, and paternal grandfather; Kidney disease in her paternal grandfather; Stroke in her paternal grandfather.  ROS:   All other ROS reviewed and negative. Pertinent positives noted in the HPI.     EKGs/Labs/Other Studies Reviewed:   The following studies were personally reviewed by me today:  EKG:  EKG is *** ordered today.  The ekg ordered today demonstrates ***, and was personally reviewed by me.   Zio 11/15/2021 Normal  Echo 11/18/2021 EF 55-60%, no RWMA   Recent Labs: 09/20/2021: ALT 10; Hemoglobin 12.3; Platelets 422 03/24/2022: BUN 12; Creatinine, Ser 0.73; Potassium 4.7; Sodium 142; TSH 0.990   Recent Lipid Panel    Component Value Date/Time   CHOL 203 (H) 03/24/2022 1534   TRIG 68 03/24/2022 1534   HDL 57 03/24/2022 1534   CHOLHDL 3.6 03/24/2022 1534   CHOLHDL 3 03/21/2021  1014   VLDL 9.6 03/21/2021 1014   LDLCALC 134 (H) 03/24/2022 1534    Physical Exam:   VS:  There were no vitals taken for this visit.   Wt Readings from Last 3 Encounters:  04/20/22 237 lb (107.5 kg)  03/24/22 237 lb (107.5 kg)  01/13/22 234 lb (106.1 kg)    General: Well nourished, well developed, in no acute distress Head: Atraumatic, normal size  Eyes: PEERLA, EOMI  Neck: Supple, no JVD Endocrine: No thryomegaly Cardiac: Normal S1, S2; RRR; no murmurs, rubs, or gallops Lungs: Clear to auscultation bilaterally, no  wheezing, rhonchi or rales  Abd: Soft, nontender, no hepatomegaly  Ext: No edema, pulses 2+ Musculoskeletal: No deformities, BUE and BLE strength normal and equal Skin: Warm and dry, no rashes   Neuro: Alert and oriented to person, place, time, and situation, CNII-XII grossly intact, no focal deficits  Psych: Normal mood and affect   ASSESSMENT:   Jacqueline Olson is a 21 y.o. female who presents for the following: No diagnosis found.  PLAN:   There are no diagnoses linked to this encounter.  {Are you ordering a CV Procedure (e.g. stress test, cath, DCCV, TEE, etc)?   Press F2        :[id number]  Disposition: No follow-ups on file.  Medication Adjustments/Labs and Tests Ordered: Current medicines are reviewed at length with the patient today.  Concerns regarding medicines are outlined above.  No orders of the defined types were placed in this encounter.  No orders of the defined types were placed in this encounter.   There are no Patient Instructions on file for this visit.   Time Spent with Patient: I have spent a total of *** minutes with patient reviewing hospital notes, telemetry, EKGs, labs and examining the patient as well as establishing an assessment and plan that was discussed with the patient.  > 50% of time was spent in direct patient care.  Signed, Addison Naegeli. Audie Box, MD, [facility]  [address] [place], [place] [address] [phone]  05/08/2022 2:46 PM

## 2022-05-11 ENCOUNTER — Ambulatory Visit: Payer: No Typology Code available for payment source | Admitting: Cardiovascular Disease

## 2022-05-11 DIAGNOSIS — R55 Syncope and collapse: Secondary | ICD-10-CM

## 2022-05-16 ENCOUNTER — Ambulatory Visit: Payer: No Typology Code available for payment source

## 2022-05-23 ENCOUNTER — Other Ambulatory Visit (HOSPITAL_COMMUNITY): Payer: Self-pay

## 2022-06-07 ENCOUNTER — Encounter: Payer: Self-pay | Admitting: Internal Medicine

## 2022-06-19 ENCOUNTER — Telehealth: Payer: Self-pay

## 2022-06-19 NOTE — Telephone Encounter (Signed)
Jacqueline Olson needs a PA.

## 2022-06-20 ENCOUNTER — Other Ambulatory Visit (HOSPITAL_COMMUNITY): Payer: Self-pay

## 2022-06-20 ENCOUNTER — Telehealth: Payer: Self-pay | Admitting: Pharmacy Technician

## 2022-06-20 NOTE — Telephone Encounter (Signed)
06/20/22-06/21/23 Patient Advocate Encounter   Received notification from CoverMyMeds that prior authorization for Amethia lo is required by his/her insurance [facility]. (Unable to process PA through CoverMyMeds. Unable to find med) Jeanene Erb [phone]  PA submitted on 06/20/22 (verbally over the phone) Prior Authorization has been approved.    PA# [id number] Effective dates: 06/20/22 through 06/21/23  Per Test Claim Patients co-pay is $57.66 at one of our pharmacies. (Likely less if pt's secondary ins picks up)   Spoke with Pharmacy to Process. - pt's secondary ins not accepting the claim. Pharmacy is working on getting it figured out.   Patient Advocate Fax:  [phone]

## 2022-09-11 ENCOUNTER — Ambulatory Visit: Payer: No Typology Code available for payment source | Admitting: Psychiatry

## 2022-09-11 VITALS — BP 128/87 | HR 99 | Ht 61.0 in | Wt 251.0 lb

## 2022-09-11 DIAGNOSIS — G43009 Migraine without aura, not intractable, without status migrainosus: Secondary | ICD-10-CM | POA: Diagnosis not present

## 2022-09-11 MED ORDER — NORTRIPTYLINE HCL 25 MG PO CAPS: 25.0000 mg | ORAL_CAPSULE | Freq: Every day | ORAL | 6 refills | Status: DC

## 2022-09-11 MED ORDER — NURTEC 75 MG PO TBDP: 75.0000 mg | ORAL_TABLET | ORAL | 6 refills | Status: DC | PRN

## 2022-09-11 NOTE — Patient Instructions (Signed)
Start Nurtec as needed for migraine. Take 1 pill  at onset of migraine. Max dose 1 pill in 24 hours  Start nortriptyline 25 mg at bedtime for headache prevention

## 2022-09-11 NOTE — Progress Notes (Signed)
   CC:  headaches  Follow-up Visit  Last visit: 04/20/22  Brief HPI: 21 year old female who follows in clinic for migraines. Brain MRI 02/27/22 was unremarkable.  At her list visit she was started on naratriptan for migraine rescue. Topamax was restarted for prevention. She was referred to neck PT.  Interval History: Headaches are no longer daily, but she continues to have them ~3 days per week. Topamax was not very helpful and she had trouble keeping track of it so she stopped taking it. Naratriptan helps take the edge off but causes chest tightness.    Headache days per month: 12 Headache free days per month: 18  Current Headache Regimen: Preventative: none Abortive: naratriptan 2.5 mg PRN   Prior Therapies                                  Ibuprofen Topamax 100 mg QHS Maxalt 10 mg PRN - chest and throat tightness Naratriptan 2.5 mg PRN - chest and throat tightness  Physical Exam:   Vital Signs: BP 128/87   Pulse 99   Ht 5\' 1"  (1.549 m)   Wt 251 lb (113.9 kg)   BMI 47.43 kg/m  GENERAL:  well appearing, in no acute distress, alert  SKIN:  Color, texture, turgor normal. No rashes or lesions HEAD:  Normocephalic/atraumatic. RESP: normal respiratory effort MSK:  No gross joint deformities.   NEUROLOGICAL: Mental Status: Alert, oriented to person, place and time, Follows commands, and Speech fluent and appropriate. Cranial Nerves: PERRL, face symmetric, no dysarthria, hearing grossly intact Motor: moves all extremities equally Gait: normal-based.  IMPRESSION: 21 year old female who presents for follow up of migraines. She stopped Topamax as it was only minimally effective and she had trouble keeping track of the medication. She thinks she would be able to keep track of a pill if she only had to take one pill once per day. Will start nortriptyline for migraine prevention. If this is ineffective, suspect she would do well with a monthly injection or Botox as she has  difficulty keeping track of her pills. She has had side effects with multiple triptans. Will start Nurtec for migraine rescue.  PLAN: -Prevention: Start nortripytline 25 mg QHS -Rescue: Start Nurtec 75 mg PRN -Next steps: consider CGRP or Botox  Follow-up: 3 months  I spent a total of 17 minutes on the date of the service. Headache education was done. Discussed treatment options including preventive and acute medications. Discussed medication side effects, adverse reactions and drug interactions. Written educational materials and patient instructions outlining all of the above were given.  Genia Harold, MD 09/11/22 3:45 PM

## 2022-09-12 ENCOUNTER — Telehealth: Payer: Self-pay

## 2022-09-12 NOTE — Telephone Encounter (Signed)
PA for Nurtec 75MG  dispersible tablets submitted via CMM  Key: [id number] Your information has been sent to [employer]. Awaiting determination     Insurance info provided by [employer].  PCN [id number] Group [id number] Bin [id number] ID [id number]

## 2022-09-13 NOTE — Telephone Encounter (Addendum)
Nurtec denial received via fax, health plan criteria rq for pt to be taking Topamax. Pt was taking but nonaffective. Will try to appeal.   Contacted insurance to appeal, spoke to stephanie. She will fax appeal form to complete to POD 3

## 2022-09-14 NOTE — Telephone Encounter (Signed)
Letter signed, faxed back to [facility] at [phone]. Confirmation received.

## 2022-09-14 NOTE — Telephone Encounter (Addendum)
Contacted insurance again x3 due to not receiving form/ letter of medical necessity via fax yet. They are going to send it again for the third time. If I do not receive I will create generic letter and fax to number provided.   Generic letter for denial created, placed on MD desk for signature

## 2022-09-25 NOTE — Telephone Encounter (Addendum)
Contacted [employer], spoke to [place]  Determination still pending. Will call back end of week

## 2022-10-02 ENCOUNTER — Encounter: Payer: Self-pay | Admitting: Internal Medicine

## 2022-10-02 ENCOUNTER — Ambulatory Visit (INDEPENDENT_AMBULATORY_CARE_PROVIDER_SITE_OTHER): Payer: No Typology Code available for payment source | Admitting: Internal Medicine

## 2022-10-02 VITALS — BP 124/82 | HR 98 | Ht 61.0 in | Wt 250.0 lb

## 2022-10-02 DIAGNOSIS — E010 Iodine-deficiency related diffuse (endemic) goiter: Secondary | ICD-10-CM | POA: Diagnosis not present

## 2022-10-02 DIAGNOSIS — Q991 46, XX true hermaphrodite: Secondary | ICD-10-CM

## 2022-10-02 MED ORDER — LEVONORGEST-ETH ESTRAD 91-DAY 0.1-0.02 & 0.01 MG PO TABS: 1.0000 | ORAL_TABLET | Freq: Every day | ORAL | 3 refills | Status: DC

## 2022-10-02 NOTE — Progress Notes (Signed)
Name: Jacqueline Olson  MRN/ DOB: [id number], 2001/02/25    Age/ Sex: 21 y.o., female     PCP: Deatra James, MD   Reason for Endocrinology Evaluation: Premature Ovarian Failure     Initial Endocrinology Clinic Visit: 11/17/2019    PATIENT IDENTIFIER: Jacqueline Olson is a 21 y.o., female with a past medical history of Prediabetes, obesity and premature ovarian failure  . She has followed with [facility] since 11/17/2019 for consultative assistance with management of her Premature ovarian failure.   HISTORICAL SUMMARY: The patient was first diagnosed with premature ovarian failure during evaluation of irregular menses in 04/2019 with an FSH of 58 mIU/mL and LH 34.3 mIU/mL  With an estradiol of 11.2 pg/mL. She was started on Combined OCP . 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   Per pt she had menarche at age 70. Her menstruations have been regular until the age of 24 when her periods have become irregular.  She was also started on metformin for pre-diabetes  Graduated from high school,currently in cosmetology school  Father with diabetes.  Maternal aunt with thyroid disease.    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 No evidence of Turner syndrome and it was determined no cardiology referral is needed and this deletion only causes premature ovarian failure .  No Hx of premature ovarian failure in the family  SUBJECTIVE:    Today (10/02/2022):  [name] is here for a follow up on her diagnosis of premature ovarian failure, and thyromegaly   Denies hot flashes  Denies worsening local neck swelling, but has occasional dysphagia  Denies heartburn  Has diarrhea lately  Denies nausea, vomiting and diarrhea  Deniea acne  Denies DVT's and PE Sister with clotting disorders/PE  , has lupus  Denies spotting  Not sexually active    COC's   HISTORY:  Past Medical History:  Past Medical History:  Diagnosis Date   ACL tear 02/05/2016   right knee   Medial meniscus tear 02/05/2016   right knee   Migraines    Vasovagal syncope    Vitamin D deficiency    Past Surgical History:  Past Surgical History:  Procedure Laterality Date   CENTRAL VENOUS CATHETER INSERTION  12-Jan-2001   KNEE ARTHROSCOPY WITH ANTERIOR CRUCIATE LIGAMENT (ACL) REPAIR WITH HAMSTRING GRAFT Right 03/02/2016   Procedure: RIGHT KNEE ARTHROSCOPY,PARTIAL MEDIAL MENISECTOMY WITH ANTERIOR CRUCIATE LIGAMENT (ACL) REPAIR WITH HAMSTRING AUTOGRAFT;  Surgeon: Loreta Ave, MD;  Location: [facility];  Service: Orthopedics;  Laterality: Right;   KNEE ARTHROSCOPY WITH LATERAL MENISECTOMY Right 03/02/2016   Procedure: KNEE ARTHROSCOPY WITH PARTIAL LATERAL MENISECTOMY;  Surgeon: Loreta Ave, MD;  Location: [facility];  Service: Orthopedics;  Laterality: Right;   Social History:  reports that she has never smoked. She has never used smokeless tobacco. She reports that she does not drink alcohol and does not use drugs. Family History:  Family History  Problem Relation Age of Onset   Hypertension Mother    Diabetes Father    Hypertension Father    Kidney disease Paternal Grandfather        ESRD/dialysis   Hypertension Paternal Grandfather    Diabetes Paternal Grandfather    Stroke Paternal Grandfather      HOME MEDICATIONS: Allergies as of 10/02/2022   No Known Allergies  Medication List        Accurate as of October 02, 2022  4:20 PM. If you have any questions, ask your nurse or doctor.          STOP taking these medications    Investigational vitamin D 600 UNITS capsule SWOG [id number] Stopped by: Scarlette Shorts, MD       TAKE these medications    cetirizine 10 MG tablet Commonly known as: ZYRTEC Take 10 mg by mouth daily.   ibuprofen 600 MG tablet Commonly known as: ADVIL Take 1 tablet  (600 mg total) by mouth every 6 (six) hours as needed for moderate pain.   Levonorgestrel-Ethinyl Estradiol 0.1-0.02 & 0.01 MG tablet Commonly known as: AMETHIA Take 1 tablet by mouth daily.   metFORMIN 500 MG 24 hr tablet Commonly known as: GLUCOPHAGE-XR Take 500 mg by mouth daily.   naproxen sodium 550 MG tablet Commonly known as: Anaprox DS Take 1 tablet (550 mg total) by mouth 2 (two) times daily with a meal.   nortriptyline 25 MG capsule Commonly known as: PAMELOR Take 1 capsule (25 mg total) by mouth at bedtime.   Nurtec 75 MG Tbdp Generic drug: Rimegepant Sulfate Take 75 mg by mouth as needed.   topiramate 50 MG tablet Commonly known as: Topamax Take one pill at bedtime for one week, then increase to 2 pills at bedtime   Vitamin D (Ergocalciferol) 1.25 MG (50000 UNIT) Caps capsule Commonly known as: DRISDOL Take 1 capsule by mouth once a week.          OBJECTIVE:   PHYSICAL EXAM: VS: BP 124/82 (BP Location: Left Arm, Patient Position: Sitting, Cuff Size: Large)   Pulse 98   Ht 5\' 1"  (1.549 m)   Wt 250 lb (113.4 kg)   SpO2 99%   BMI 47.24 kg/m    EXAM: General: Pt appears well and is in NAD Right thyroid enlargement appreciated, normal left lobe   Lungs: Clear with good BS bilat with no rales, rhonchi, or wheezes  Heart: Auscultation: RRR.  Abdomen: Normoactive bowel sounds, soft, nontender, without masses or organomegaly palpable  Extremities:  BL LE: No pretibial edema normal ROM and strength.  Mental Status: Judgment, insight: Intact Orientation: Oriented to time, place, and person Mood and affect: No depression, anxiety, or agitation     DATA REVIEWED:  09/12/2022 BUN 8 Cr 0.7 GFR 126 LDL 134 A1c 7.0%   Thyroid ultrasound 03/27/2022  Normal sized and appearing thyroid without discrete nodule or mass, similar to the 09/2020 examination.     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 Deletions of the X long arm have correlated with premature menopause or gonadal dysgenesis.  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 No evidence of Turner syndrome and it was determined no cardiology referral is needed and this deletion only causes premature ovarian failure .  - She is tolerating COC's without side effects    Medications :  Continue Amethia 0.1-0.02 daily      2. Thyromegaly  :  - She is clinically  and biochemically euthyroid  - No local neck symptoms - Repeat ultrasound 2022 did not show any evidence of thyroid nodules  - no further follow up is needed at this time      F/U in 1 yr   Signed electronically by: Mack Guise, MD  [facility]  [facility] [address] [place], [place] [address] Phone: [phone] FAX: [phone]      CC: [address], [place] [address] Phone: [phone]  Fax: [phone]   Return to Endocrinology clinic as below: Future Appointments  Date Time Provider [place]  12/27/2022  2:45 PM Frann Rider, NP [facility] None  10/02/2023  1:00 PM Amron Guerrette, Melanie Crazier, MD [facility] None

## 2022-10-19 NOTE — Telephone Encounter (Signed)
Approval received via fax approved coverage until Sep 20, 2023.

## 2022-10-19 NOTE — Telephone Encounter (Signed)
Contacted [employer] for an update on appeal. I got transferred 7 times, they are unable to find pt. She has multiple plans and couldn't find her under Mem ID I did PA with that's on the [employer] notice of denial letter. Was on phone for over 53 mins.  Rutherford Nail got to someone who could help, was informed October 10 it was approved, informed him I called 10/16 and was told it was still pending. He is faxing me the approval to my POD.

## 2022-12-18 ENCOUNTER — Encounter: Payer: Self-pay | Admitting: Internal Medicine

## 2022-12-26 NOTE — Progress Notes (Deleted)
   CC:  headaches  Follow-up Visit  Last visit: 09/11/2022 with Dr. Billey Gosling  Brief HPI: 22 year old female who follows in clinic for migraines. Brain MRI 02/27/22 was unremarkable.  At her list visit, she was started on nortriptyline for preventative and Nurtec for rescue   Interval History:    Headaches are no longer daily, but she continues to have them ~3 days per week. Topamax was not very helpful and she had trouble keeping track of it so she stopped taking it. Naratriptan helps take the edge off but causes chest tightness.    Headache days per month: 12 Headache free days per month: 18  Current Headache Regimen: Preventative: none Abortive: naratriptan 2.5 mg PRN   Prior Therapies                                  Ibuprofen Topamax 100 mg QHS Maxalt 10 mg PRN - chest and throat tightness Naratriptan 2.5 mg PRN - chest and throat tightness  Physical Exam:   Vital Signs: There were no vitals taken for this visit. GENERAL:  well appearing, in no acute distress, alert  SKIN:  Color, texture, turgor normal. No rashes or lesions HEAD:  Normocephalic/atraumatic. RESP: normal respiratory effort MSK:  No gross joint deformities.   NEUROLOGICAL: Mental Status: Alert, oriented to person, place and time, Follows commands, and Speech fluent and appropriate. Cranial Nerves: PERRL, face symmetric, no dysarthria, hearing grossly intact Motor: moves all extremities equally Gait: normal-based.  IMPRESSION: 22 year old female who presents for follow up of migraines.  She was started on nortriptyline and Nurtec at prior visit  She stopped Topamax as it was only minimally effective and she had trouble keeping track of the medication. She thinks she would be able to keep track of a pill if she only had to take one pill once per day. Will start nortriptyline for migraine prevention. If this is ineffective, suspect she would do well with a monthly injection or Botox as she has  difficulty keeping track of her pills. She has had side effects with multiple triptans. Will start Nurtec for migraine rescue.  PLAN: -Prevention: Start nortripytline 25 mg QHS -Rescue: Start Nurtec 75 mg PRN -Next steps: consider CGRP or Botox  Follow-up: 3 months    I spent *** minutes of face-to-face and non-face-to-face time with patient.  This included previsit chart review, lab review, study review, order entry, electronic health record documentation, patient education  Frann Rider, [employer]  [facility] [address] [place], [place] [address]  Phone [phone] Fax [phone] Note: This document was prepared with digital dictation and possible smart phrase technology. Any transcriptional errors that result from this process are unintentional.

## 2022-12-27 ENCOUNTER — Telehealth: Payer: Self-pay | Admitting: Adult Health

## 2022-12-27 ENCOUNTER — Ambulatory Visit: Payer: 59 | Admitting: Adult Health

## 2022-12-27 NOTE — Telephone Encounter (Signed)
LVM and sent mychart msg informing pt of cancellation of today's appt- Janett Billow out sick.

## 2022-12-28 ENCOUNTER — Ambulatory Visit: Payer: 59 | Admitting: Adult Health

## 2023-01-02 NOTE — Progress Notes (Deleted)
   CC:  headaches  Follow-up Visit  Last visit: 09/11/2022 with Dr. Chima  Brief HPI: 21 year old female who follows in clinic for migraines. Brain MRI 02/27/22 was unremarkable.  At her list visit, she was started on nortriptyline for preventative and Nurtec for rescue   Interval History:    Headaches are no longer daily, but she continues to have them ~3 days per week. Topamax was not very helpful and she had trouble keeping track of it so she stopped taking it. Naratriptan helps take the edge off but causes chest tightness.    Headache days per month: 12 Headache free days per month: 18  Current Headache Regimen: Preventative: none Abortive: naratriptan 2.5 mg PRN   Prior Therapies                                  Ibuprofen Topamax 100 mg QHS Maxalt 10 mg PRN - chest and throat tightness Naratriptan 2.5 mg PRN - chest and throat tightness  Physical Exam:   Vital Signs: There were no vitals taken for this visit. GENERAL:  well appearing, in no acute distress, alert  SKIN:  Color, texture, turgor normal. No rashes or lesions HEAD:  Normocephalic/atraumatic. RESP: normal respiratory effort MSK:  No gross joint deformities.   NEUROLOGICAL: Mental Status: Alert, oriented to person, place and time, Follows commands, and Speech fluent and appropriate. Cranial Nerves: PERRL, face symmetric, no dysarthria, hearing grossly intact Motor: moves all extremities equally Gait: normal-based.  IMPRESSION: 21 year old female who presents for follow up of migraines.  She was started on nortriptyline and Nurtec at prior visit  She stopped Topamax as it was only minimally effective and she had trouble keeping track of the medication. She thinks she would be able to keep track of a pill if she only had to take one pill once per day. Will start nortriptyline for migraine prevention. If this is ineffective, suspect she would do well with a monthly injection or Botox as she has  difficulty keeping track of her pills. She has had side effects with multiple triptans. Will start Nurtec for migraine rescue.  PLAN: -Prevention: Start nortripytline 25 mg QHS -Rescue: Start Nurtec 75 mg PRN -Next steps: consider CGRP or Botox  Follow-up: 3 months    I spent *** minutes of face-to-face and non-face-to-face time with patient.  This included previsit chart review, lab review, study review, order entry, electronic health record documentation, patient education  Jacqueline Olson, AGNP-BC  [facility] [address] [place], [place] [address]  Phone [phone] Fax [phone] Note: This document was prepared with digital dictation and possible smart phrase technology. Any transcriptional errors that result from this process are unintentional.  

## 2023-01-03 ENCOUNTER — Ambulatory Visit: Payer: 59 | Admitting: Adult Health

## 2023-01-03 ENCOUNTER — Encounter: Payer: Self-pay | Admitting: Adult Health

## 2023-04-12 NOTE — Progress Notes (Signed)
Chief Complaint  Patient presents with   Room 1    Pt is here Alone. Pt states that things with her headaches have been going okay since her last appointment. Pt states that she gets 1 headache per day and she wakes up with them, Pt states that her headaches are somewhat manageable. Pt states that she takes Ibuprofen or Tylenol for her headaches. Pt states that she doesn't have nauseas with her headaches but she does with her Migraines. Pt states that she sometimes has light sensitivity with her headaches.     HISTORY OF PRESENT ILLNESS:  04/16/23 ALL:  Jacqueline Olson is a 22 y.o. female here today for follow up for headaches. She was seen in consult with Dr Delena Bali 01/2022. She was started on topiramate but stopped after about 3 weeks as she couldn't remember titration schedule. Rizatriptan started for abortive therapy but caused chest and throat tightness. She was restarted on topiramate 04/2022 and naratriptan used for abortive therapy. PT referral placed for neck pain. At last visit 09/2022, she had stopped topiramate and felt naratriptan caused throat tightness. She was started on nortriptyline 25mg  QHS and Nurtec PRN. Since, she reports not knowing nortriptyline was called in. She did start Nurtec and felt it was effective. She continues to have daily headaches. She describes 4-5 as being migrainous per month. She is taking Tylenol or ibuprofen every day.   She did not attend PT for neck pain. She reports cancelling the appt. She has been referred back to PT for chest pains and plans to start therapy tomorrow.   Sleep study with [facility] was normal in 2022.   HISTORY (copied from Dr Quentin Mulling previous note)  22 year old female who follows in clinic for migraines. Brain MRI 02/27/22 was unremarkable.   At her list visit she was started on naratriptan for migraine rescue. Topamax was restarted for prevention. She was referred to neck PT.   Interval History: Headaches are no longer  daily, but she continues to have them ~3 days per week. Topamax was not very helpful and she had trouble keeping track of it so she stopped taking it. Naratriptan helps take the edge off but causes chest tightness.      Headache days per month: 12 Headache free days per month: 18   Current Headache Regimen: Preventative: none Abortive: naratriptan 2.5 mg PRN     Prior Therapies                                  Ibuprofen Topamax 100 mg QHS Maxalt 10 mg PRN - chest and throat tightness Naratriptan 2.5 mg PRN - chest and throat tightness   REVIEW OF SYSTEMS: Out of a complete 14 system review of symptoms, the patient complains only of the following symptoms, headaches and all other reviewed systems are negative.   ALLERGIES: No Known Allergies   HOME MEDICATIONS: Outpatient Medications Prior to Visit  Medication Sig Dispense Refill   ibuprofen (ADVIL) 600 MG tablet Take 1 tablet (600 mg total) by mouth every 6 (six) hours as needed for moderate pain. 30 tablet 0   Levonorgestrel-Ethinyl Estradiol (AMETHIA) 0.1-0.02 & 0.01 MG tablet Take 1 tablet by mouth daily. 84 tablet 3   metFORMIN (GLUCOPHAGE-XR) 500 MG 24 hr tablet Take 500 mg by mouth daily.     naproxen sodium (ANAPROX DS) 550 MG tablet Take 1 tablet (550 mg total) by  mouth 2 (two) times daily with a meal. 30 tablet 0   cetirizine (ZYRTEC) 10 MG tablet Take 10 mg by mouth daily. (Patient not taking: Reported on 04/16/2023)     nortriptyline (PAMELOR) 25 MG capsule Take 1 capsule (25 mg total) by mouth at bedtime. (Patient not taking: Reported on 10/02/2022) 30 capsule 6   Rimegepant Sulfate (NURTEC) 75 MG TBDP Take 75 mg by mouth as needed. (Patient not taking: Reported on 04/16/2023) 8 tablet 6   topiramate (TOPAMAX) 50 MG tablet Take one pill at bedtime for one week, then increase to 2 pills at bedtime (Patient not taking: Reported on 04/16/2023) 60 tablet 3   Vitamin D, Ergocalciferol, (DRISDOL) 1.25 MG (50000 UNIT) CAPS capsule  Take 1 capsule by mouth once a week. (Patient not taking: Reported on 10/02/2022)     No facility-administered medications prior to visit.     PAST MEDICAL HISTORY: Past Medical History:  Diagnosis Date   ACL tear 02/05/2016   right knee   Medial meniscus tear 02/05/2016   right knee   Migraines    Vasovagal syncope    Vitamin D deficiency      PAST SURGICAL HISTORY: Past Surgical History:  Procedure Laterality Date   CENTRAL VENOUS CATHETER INSERTION  11/05/2001   KNEE ARTHROSCOPY WITH ANTERIOR CRUCIATE LIGAMENT (ACL) REPAIR WITH HAMSTRING GRAFT Right 03/02/2016   Procedure: RIGHT KNEE ARTHROSCOPY,PARTIAL MEDIAL MENISECTOMY WITH ANTERIOR CRUCIATE LIGAMENT (ACL) REPAIR WITH HAMSTRING AUTOGRAFT;  Surgeon: Loreta Ave, MD;  Location: [facility];  Service: Orthopedics;  Laterality: Right;   KNEE ARTHROSCOPY WITH LATERAL MENISECTOMY Right 03/02/2016   Procedure: KNEE ARTHROSCOPY WITH PARTIAL LATERAL MENISECTOMY;  Surgeon: Loreta Ave, MD;  Location: [facility];  Service: Orthopedics;  Laterality: Right;     FAMILY HISTORY: Family History  Problem Relation Age of Onset   Hypertension Mother    Diabetes Father    Hypertension Father    Kidney disease Paternal Grandfather        ESRD/dialysis   Hypertension Paternal Grandfather    Diabetes Paternal Grandfather    Stroke Paternal Grandfather      SOCIAL HISTORY: Social History   Socioeconomic History   Marital status: Single    Spouse name: Not on file   Number of children: 0   Years of education: Not on file   Highest education level: Associate degree: occupational, Scientist, product/process development, or vocational program  Occupational History   Occupation: Hair stylist  Tobacco Use   Smoking status: Never   Smokeless tobacco: Never  Vaping Use   Vaping Use: Never used  Substance and Sexual Activity   Alcohol use: No   Drug use: No   Sexual activity: Not on file  Other Topics Concern   Not on file   Social History Narrative   Lives with parents, siblings   coffee   Social Determinants of Health   Financial Resource Strain: Not on file  Food Insecurity: Not on file  Transportation Needs: Not on file  Physical Activity: Not on file  Stress: Not on file  Social Connections: Not on file  Intimate Partner Violence: Not At Risk (05/12/2021)   Humiliation, Afraid, Rape, and Kick questionnaire    Fear of Current or Ex-Partner: No    Emotionally Abused: No    Physically Abused: No    Sexually Abused: No     PHYSICAL EXAM  Vitals:   04/16/23 0855  BP: (!) 144/94  Pulse: 88  Weight: 248  lb (112.5 kg)  Height: 5\' 1"  (1.549 m)   Body mass index is 46.86 kg/m.  Generalized: Well developed, in no acute distress  Cardiology: normal rate and rhythm, no murmur auscultated  Respiratory: clear to auscultation bilaterally    Neurological examination  Mentation: Alert oriented to time, place, history taking. Follows all commands speech and language fluent Cranial nerve II-XII: Pupils were equal round reactive to light. Extraocular movements were full, visual field were full on confrontational test. Facial sensation and strength were normal. Uvula tongue midline. Head turning and shoulder shrug  were normal and symmetric. Motor: The motor testing reveals 5 over 5 strength of all 4 extremities. Good symmetric motor tone is noted throughout.  Sensory: Sensory testing is intact to soft touch on all 4 extremities. No evidence of extinction is noted.  Coordination: Cerebellar testing reveals good finger-nose-finger and heel-to-shin bilaterally.  Gait and station: Gait is normal. Tandem gait is normal. Romberg is negative. No drift is seen.  Reflexes: Deep tendon reflexes are symmetric and normal bilaterally.    DIAGNOSTIC DATA (LABS, IMAGING, TESTING) - I reviewed patient records, labs, notes, testing and imaging myself where available.  Lab Results  Component Value Date   WBC 7.1  09/20/2021   HGB 12.3 09/20/2021   HCT 35.9 (L) 09/20/2021   MCV 82.0 09/20/2021   PLT 422 (H) 09/20/2021      Component Value Date/Time   NA 142 03/24/2022 1534   K 4.7 03/24/2022 1534   CL 103 03/24/2022 1534   CO2 26 03/24/2022 1534   GLUCOSE 77 03/24/2022 1534   GLUCOSE 102 (H) 09/20/2021 1011   BUN 12 03/24/2022 1534   CREATININE 0.73 03/24/2022 1534   CREATININE 0.63 12/13/2017 1538   CALCIUM 9.5 03/24/2022 1534   PROT 7.0 09/20/2021 1011   ALBUMIN 3.4 (L) 09/20/2021 1011   AST 12 (L) 09/20/2021 1011   ALT 10 09/20/2021 1011   ALKPHOS 74 09/20/2021 1011   BILITOT 0.4 09/20/2021 1011   GFRNONAA >60 09/20/2021 1011   GFRAA >60 05/16/2019 0932   Lab Results  Component Value Date   CHOL 203 (H) 03/24/2022   HDL 57 03/24/2022   LDLCALC 134 (H) 03/24/2022   TRIG 68 03/24/2022   CHOLHDL 3.6 03/24/2022   Lab Results  Component Value Date   HGBA1C 6.1 (A) 03/24/2022   No results found for: "VITAMINB12" Lab Results  Component Value Date   TSH 0.990 03/24/2022        No data to display               No data to display           ASSESSMENT AND PLAN  22 y.o. year old female  has a past medical history of ACL tear (02/05/2016), Medial meniscus tear (02/05/2016), Migraines, Vasovagal syncope, and Vitamin D deficiency. here with    Migraine without aura and without status migrainosus, not intractable  Silvia ZALA DINALLO reports having daily headaches with 4-5 migraines per month. I have encouraged her to start nortriptyline as discussed at last visit. We will continue Nurtec as needed for abortive therapy. She was encouraged to wean use of OTC analgesics. Education materials provided. Healthy lifestyle habits encouraged. She will follow up with PCP as directed. She will return to see me in 4-6 months, sooner if needed. She verbalizes understanding and agreement with this plan.   No orders of the defined types were placed in this encounter.    Meds ordered  this encounter  Medications   nortriptyline (PAMELOR) 25 MG capsule    Sig: Take 1 capsule (25 mg total) by mouth at bedtime.    Dispense:  90 capsule    Refill:  1    Order Specific Question:   Supervising Provider    Answer:   Anson Fret [[id number]]   Rimegepant Sulfate (NURTEC) 75 MG TBDP    Sig: Take 1 tablet (75 mg total) by mouth as needed.    Dispense:  8 tablet    Refill:  6    Order Specific Question:   Supervising Provider    Answer:   Anson Fret [[id number]]     Shawnie Dapper, MSN, FNP-C 04/16/2023, 9:48 AM  [facility] [address], [address] [place], [place] [address] [phone]

## 2023-04-12 NOTE — Patient Instructions (Signed)
Below is our plan:  We will restart nortriptyline 25mg  daily at bedtime. Give medication 4-6 weeks to become effective. Continue Nurtec as needed for migraines. Try to reduce daily use of ibuprofen and Tylenol.   Please make sure you are staying well hydrated. I recommend 50-60 ounces daily. Well balanced diet and regular exercise encouraged. Consistent sleep schedule with 6-8 hours recommended.   Please continue follow up with care team as directed.   Follow up with me in 4-6 months   You may receive a survey regarding today's visit. I encourage you to leave honest feed back as I do use this information to improve patient care. Thank you for seeing me today!   GENERAL HEADACHE INFORMATION:   Natural supplements: Magnesium Oxide or Magnesium Glycinate 500 mg at bed (up to 800 mg daily) Coenzyme Q10 300 mg in AM Vitamin B2- 200 mg twice a day   Add 1 supplement at a time since even natural supplements can have undesirable side effects. You can sometimes buy supplements cheaper (especially Coenzyme Q10) at www.WebmailGuide.co.za or at [employer].   Vitamins and herbs that show potential:   Magnesium: Magnesium (250 mg twice a day or 500 mg at bed) has a relaxant effect on smooth muscles such as blood vessels. Individuals suffering from frequent or daily headache usually have low magnesium levels which can be increase with daily supplementation of 400-750 mg. Three trials found 40-90% average headache reduction  when used as a preventative. Magnesium also demonstrated the benefit in menstrually related migraine.  Magnesium is part of the messenger system in the serotonin cascade and it is a good muscle relaxant.  It is also useful for constipation which can be a side effect of other medications used to treat migraine. Good sources include nuts, whole grains, and tomatoes. Side Effects: loose stool/diarrhea  Riboflavin (vitamin B 2) 200 mg twice a day. This vitamin assists nerve cells in the production of  ATP a principal energy storing molecule.  It is necessary for many chemical reactions in the body.  There have been at least 3 clinical trials of riboflavin using 400 mg per day all of which suggested that migraine frequency can be decreased.  All 3 trials showed significant improvement in over half of migraine sufferers.  The supplement is found in bread, cereal, milk, meat, and poultry.  Most Americans get more riboflavin than the recommended daily allowance, however riboflavin deficiency is not necessary for the supplements to help prevent headache. Side effects: energizing, green urine   Coenzyme Q10: This is present in almost all cells in the body and is critical component for the conversion of energy.  Recent studies have shown that a nutritional supplement of CoQ10 can reduce the frequency of migraine attacks by improving the energy production of cells as with riboflavin.  Doses of 150 mg twice a day have been shown to be effective.   Melatonin: Increasing evidence shows correlation between melatonin secretion and headache conditions.  Melatonin supplementation has decreased headache intensity and duration.  It is widely used as a sleep aid.  Sleep is natures way of dealing with migraine.  A dose of 3 mg is recommended to start for headaches including cluster headache. Higher doses up to 15 mg has been reviewed for use in Cluster headache and have been used. The rationale behind using melatonin for cluster is that many theories regarding the cause of Cluster headache center around the disruption of the normal circadian rhythm in the brain.  This helps  restore the normal circadian rhythm.   HEADACHE DIET: Foods and beverages which may trigger migraine Note that only 20% of headache patients are food sensitive. You will know if you are food sensitive if you get a headache consistently 20 minutes to 2 hours after eating a certain food. Only cut out a food if it causes headaches, otherwise you might  remove foods you enjoy! What matters most for diet is to eat a well balanced healthy diet full of vegetables and low fat protein, and to not miss meals.   Chocolate, other sweets ALL cheeses except cottage and cream cheese Dairy products, yogurt, sour cream, ice cream Liver Meat extracts (Bovril, Marmite, meat tenderizers) Meats or fish which have undergone aging, fermenting, pickling or smoking. These include: Hotdogs,salami,Lox,sausage, mortadellas,smoked salmon, pepperoni, Pickled herring Pods of broad bean (English beans, Chinese pea pods, [place] (fava) beans, lima and navy beans Ripe avocado, ripe banana Yeast extracts or active yeast preparations such as Brewer's or Fleishman's (commercial bakes goods are permitted) Tomato based foods, pizza (lasagna, etc.)   MSG (monosodium glutamate) is disguised as many things; look for these common aliases: Monopotassium glutamate Autolysed yeast Hydrolysed protein Sodium caseinate "flavorings" "all natural preservatives" Nutrasweet   Avoid all other foods that convincingly provoke headaches.   Resources: The Dizzy [place] Laundry Your Headache Diet, migrainestrong.com  https://zamora-andrews.com/   Caffeine and Migraine For patients that have migraine, caffeine intake more than 3 days per week can lead to dependency and increased migraine frequency. I would recommend cutting back on your caffeine intake as best you can. The recommended amount of caffeine is 200-300 mg daily, although migraine patients may experience dependency at even lower doses. While you may notice an increase in headache temporarily, cutting back will be helpful for headaches in the long run. For more information on caffeine and migraine, visit: https://americanmigrainefoundation.org/resource-library/caffeine-and-migraine/   Headache Prevention Strategies:   1. Maintain a headache diary; learn to identify and avoid triggers.   - This can be a simple note where you log when you had a headache, associated symptoms, and medications used - There are several smartphone apps developed to help track migraines: Migraine Buddy, Migraine Monitor, Curelator N1-Headache App   Common triggers include: Emotional triggers: Emotional/Upset family or friends Emotional/Upset occupation Business reversal/success Anticipation anxiety Crisis-serious Post-crisis periodNew job/position   Physical triggers: Vacation Day Weekend Strenuous Exercise High Altitude Location New Move Menstrual Day Physical Illness Oversleep/Not enough sleep Weather changes Light: Photophobia or light sesnitivity treatment involves a balance between desensitization and reduction in overly strong input. Use dark polarized glasses outside, but not inside. Avoid bright or fluorescent light, but do not dim environment to the point that going into a normally lit room hurts. Consider FL-41 tint lenses, which reduce the most irritating wavelengths without blocking too much light.  These can be obtained at axonoptics.com or theraspecs.com Foods: see list above.   2. Limit use of acute treatments (over-the-counter medications, triptans, etc.) to no more than 2 days per week or 10 days per month to prevent medication overuse headache (rebound headache).     3. Follow a regular schedule (including weekends and holidays): Don't skip meals. Eat a balanced diet. 8 hours of sleep nightly. Minimize stress. Exercise 30 minutes per day. Being overweight is associated with a 5 times increased risk of chronic migraine. Keep well hydrated and drink 6-8 glasses of water per day.   4. Initiate non-pharmacologic measures at the earliest onset of your headache. Rest and quiet environment. Relax and  reduce stress. Breathe2Relax is a free app that can instruct you on    some simple relaxtion and breathing techniques. Http://Dawnbuse.com is a    free website that provides  teaching videos on relaxation.  Also, there are  many apps that   can be downloaded for "mindful" relaxation.  An app called YOGA NIDRA will help walk you through mindfulness. Another app called Calm can be downloaded to give you a structured mindfulness guide with daily reminders and skill development. Headspace for guided meditation Mindfulness Based Stress Reduction Online Course: www.palousemindfulness.com Cold compresses.   5. Don't wait!! Take the maximum allowable dosage of prescribed medication at the first sign of migraine.   6. Compliance:  Take prescribed medication regularly as directed and at the first sign of a migraine.   7. Communicate:  Call your physician when problems arise, especially if your headaches change, increase in frequency/severity, or become associated with neurological symptoms (weakness, numbness, slurred speech, etc.).   8. Headache/pain management therapies: Consider various complementary methods, including medication, behavioral therapy, psychological counselling, biofeedback, massage therapy, acupuncture, dry needling, and other modalities.  Such measures may reduce the need for medications. Counseling for pain management, where patients learn to function and ignore/minimize their pain, seems to work very well.   9. Recommend changing family's attention and focus away from patient's headaches. Instead, emphasize daily activities. If first question of day is 'How are your headaches/Do you have a headache today?', then patient will constantly think about headaches, thus making them worse. Goal is to re-direct attention away from headaches, toward daily activities and other distractions.   10. Helpful Websites: www.AmericanHeadacheSociety.org PatentHood.ch www.headaches.org TightMarket.nl www.achenet.org

## 2023-04-16 ENCOUNTER — Encounter: Payer: Self-pay | Admitting: Family Medicine

## 2023-04-16 ENCOUNTER — Ambulatory Visit (INDEPENDENT_AMBULATORY_CARE_PROVIDER_SITE_OTHER): Payer: No Typology Code available for payment source | Admitting: Family Medicine

## 2023-04-16 VITALS — BP 144/94 | HR 88 | Ht 61.0 in | Wt 248.0 lb

## 2023-04-16 DIAGNOSIS — G43009 Migraine without aura, not intractable, without status migrainosus: Secondary | ICD-10-CM

## 2023-04-16 MED ORDER — NURTEC 75 MG PO TBDP: 75.0000 mg | ORAL_TABLET | ORAL | 6 refills | Status: DC | PRN

## 2023-04-16 MED ORDER — NORTRIPTYLINE HCL 25 MG PO CAPS: 25.0000 mg | ORAL_CAPSULE | Freq: Every day | ORAL | 1 refills | Status: DC

## 2023-05-14 ENCOUNTER — Encounter: Payer: Self-pay | Admitting: Psychiatry

## 2023-05-14 ENCOUNTER — Telehealth: Payer: Self-pay

## 2023-05-14 NOTE — Telephone Encounter (Signed)
Pt wanted to clarify when she tried and failed topiramate so I explained she tried for 3 weeks and couldn't remember the titration schedule. She voiced gratitude and understanding

## 2023-06-23 ENCOUNTER — Encounter: Payer: Self-pay | Admitting: Family Medicine

## 2023-06-25 NOTE — Telephone Encounter (Signed)
Pt is wanting to stop the Nortriptyline

## 2023-07-23 ENCOUNTER — Other Ambulatory Visit: Payer: Self-pay | Admitting: Family Medicine

## 2023-07-23 MED ORDER — TOPIRAMATE 50 MG PO TABS: 50.0000 mg | ORAL_TABLET | Freq: Every day | ORAL | 1 refills | Status: DC

## 2023-09-13 ENCOUNTER — Encounter: Payer: Self-pay | Admitting: Family Medicine

## 2023-10-02 ENCOUNTER — Telehealth: Payer: Self-pay

## 2023-10-02 ENCOUNTER — Ambulatory Visit: Payer: No Typology Code available for payment source | Admitting: Internal Medicine

## 2023-10-02 ENCOUNTER — Encounter: Payer: Self-pay | Admitting: Internal Medicine

## 2023-10-02 VITALS — BP 114/72 | HR 95 | Ht 61.0 in | Wt 245.0 lb

## 2023-10-02 DIAGNOSIS — E119 Type 2 diabetes mellitus without complications: Secondary | ICD-10-CM | POA: Diagnosis not present

## 2023-10-02 DIAGNOSIS — Z7985 Long-term (current) use of injectable non-insulin antidiabetic drugs: Secondary | ICD-10-CM

## 2023-10-02 DIAGNOSIS — Q991 46, XX true hermaphrodite: Secondary | ICD-10-CM | POA: Diagnosis not present

## 2023-10-02 LAB — POCT GLYCOSYLATED HEMOGLOBIN (HGB A1C): Hemoglobin A1C: 6.4 % — AB (ref 4.0–5.6)

## 2023-10-02 MED ORDER — SEMAGLUTIDE(0.25 OR 0.5MG/DOS) 2 MG/3ML ~~LOC~~ SOPN: 0.5000 mg | PEN_INJECTOR | SUBCUTANEOUS | 3 refills | Status: DC

## 2023-10-02 MED ORDER — COMBIPATCH 0.05-0.25 MG/DAY TD PTTW: 1.0000 | MEDICATED_PATCH | TRANSDERMAL | 12 refills | Status: DC

## 2023-10-02 NOTE — Patient Instructions (Addendum)
Stop birth control pills Start CombiPatch to be changed twice a week for example every Wednesday and Sunday of every week   Start Ozempic 0.25 mg once weekly for 6 weeks, then increase to 0.5 mg weekly

## 2023-10-02 NOTE — Progress Notes (Signed)
Name: Jacqueline Olson  MRN/ DOB: [id number], 08-Dec-2001    Age/ Sex: 22 y.o., female     PCP: Deatra James, MD   Reason for Endocrinology Evaluation: Premature Ovarian Failure     Initial Endocrinology Clinic Visit: 11/17/2019    PATIENT IDENTIFIER: [name] is a 22 y.o., female with a past medical history of Prediabetes, obesity and premature ovarian failure  . She has followed with [facility] since 11/17/2019 for consultative assistance with management of her Premature ovarian failure.   HISTORICAL SUMMARY: The patient was first diagnosed with premature ovarian failure during evaluation of irregular menses in 04/2019 with an FSH of 58 mIU/mL and LH 34.3 mIU/mL  With an estradiol of 11.2 pg/mL. She was started on Combined OCP . 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   Per pt she had menarche at age 27. Her menstruations have been regular until the age of 59 when her periods have become irregular.  She was also started on metformin for pre-diabetes  Graduated cosmetology school  Father with diabetes.  Maternal aunt with thyroid disease.    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 No evidence of Turner syndrome and it was determined no cardiology referral is needed and this deletion only causes premature ovarian failure .  No Hx of premature ovarian failure in the family    DIABETES HISTORY :  She was initially diagnosed with prediabetes in 2019, but her A1c increased to 7.1% in 09/2022. She was on metformin  Ozempic started 09/2023   SUBJECTIVE:    Today (10/02/2023):  Ms. [name] is here for a follow up on her diagnosis of premature ovarian failure and Pre-diabetes.   She was recently evaluated by rheumatology for elevated C-reactive protein but no evidence of inflammatory arthritis  She was also evaluated by GI for  diarrhea and abdominal pain, she was trialed on Creon which was discontinued  Continues with nausea and bloating feeling  She has not been taking Metformin for a few months.   Weight stable  Has rare hot flashes  Has diarrhea lately  Deniea acne  Denies hirsutism  Sister with clotting disorders/PE  , has lupus  Denies spotting   She does drink sweet tea She admits to imperfect adherence to COC's   COC's Metformin 500 mg daily      HISTORY:  Past Medical History:  Past Medical History:  Diagnosis Date   ACL tear 02/05/2016   right knee   Medial meniscus tear 02/05/2016   right knee   Migraines    Vasovagal syncope    Vitamin D deficiency    Past Surgical History:  Past Surgical History:  Procedure Laterality Date   CENTRAL VENOUS CATHETER INSERTION  2001/09/24   KNEE ARTHROSCOPY WITH ANTERIOR CRUCIATE LIGAMENT (ACL) REPAIR WITH HAMSTRING GRAFT Right 03/02/2016   Procedure: RIGHT KNEE ARTHROSCOPY,PARTIAL MEDIAL MENISECTOMY WITH ANTERIOR CRUCIATE LIGAMENT (ACL) REPAIR WITH HAMSTRING AUTOGRAFT;  Surgeon: Loreta Ave, MD;  Location: [facility];  Service: Orthopedics;  Laterality: Right;   KNEE ARTHROSCOPY WITH LATERAL MENISECTOMY Right 03/02/2016   Procedure: KNEE ARTHROSCOPY WITH PARTIAL LATERAL MENISECTOMY;  Surgeon: Loreta Ave, MD;  Location: [facility];  Service: Orthopedics;  Laterality: Right;   Social History:  reports that she has never smoked. She has never used smokeless tobacco. She reports that she  does not drink alcohol and does not use drugs. Family History:  Family History  Problem Relation Age of Onset   Hypertension Mother    Diabetes Father    Hypertension Father    Kidney disease Paternal Grandfather        ESRD/dialysis   Hypertension Paternal Grandfather    Diabetes Paternal Grandfather    Stroke Paternal Grandfather      HOME MEDICATIONS: Allergies as of 10/02/2023   No Known Allergies       Medication List        Accurate as of October 02, 2023  1:12 PM. If you have any questions, ask your nurse or doctor.          cetirizine 10 MG tablet Commonly known as: ZYRTEC Take 10 mg by mouth daily.   Creon 36000-114000 units Cpep capsule Generic drug: lipase/protease/amylase Take by mouth.   hyoscyamine 0.125 MG Tbdp disintergrating tablet Commonly known as: ANASPAZ Take by mouth.   ibuprofen 600 MG tablet Commonly known as: ADVIL Take 1 tablet (600 mg total) by mouth every 6 (six) hours as needed for moderate pain.   Levonorgestrel-Ethinyl Estradiol 0.1-0.02 & 0.01 MG tablet Commonly known as: AMETHIA Take 1 tablet by mouth daily.   meloxicam 15 MG tablet Commonly known as: MOBIC Take 1 tablet every day by oral route with meal(s) for 30 days.   metFORMIN 500 MG 24 hr tablet Commonly known as: GLUCOPHAGE-XR Take 500 mg by mouth daily.   naproxen sodium 550 MG tablet Commonly known as: Anaprox DS Take 1 tablet (550 mg total) by mouth 2 (two) times daily with a meal.   naratriptan 2.5 MG tablet Commonly known as: AMERGE   Nurtec 75 MG Tbdp Generic drug: Rimegepant Sulfate Take 1 tablet (75 mg total) by mouth as needed.   omeprazole 40 MG capsule Commonly known as: PRILOSEC Take by mouth.   topiramate 50 MG tablet Commonly known as: Topamax Take 1 tablet (50 mg total) by mouth daily.   Vitamin D (Ergocalciferol) 1.25 MG (50000 UNIT) Caps capsule Commonly known as: DRISDOL Take 50,000 Units by mouth once a week.          OBJECTIVE:   PHYSICAL EXAM: VS: BP 114/72 (BP Location: Left Arm, Patient Position: Sitting, Cuff Size: Large)   Pulse 95   Ht 5\' 1"  (1.549 m)   Wt 245 lb (111.1 kg)   SpO2 99%   BMI 46.29 kg/m    EXAM: General: Pt appears well and is in NAD  Lungs: Clear with good BS bilat   Heart: Auscultation: RRR.  Abdomen: Normoactive bowel sounds, soft, nontender, without masses or organomegaly palpable  Extremities:  BL  LE: No pretibial edema  Mental Status: Judgment, insight: Intact Orientation: Oriented to time, place, and person Mood and affect: No depression, anxiety, or agitation     DATA REVIEWED: 05/25/2023  BUN 7 A1c 6.3% CR 0.7 GFR 125 HDL 47 LDL 148 Triglycerides 84 Potassium 4.5    Thyroid ultrasound 03/27/2022  Normal sized and appearing thyroid without discrete nodule or mass, similar to the 09/2020 examination.     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 Deletions of the X long arm have correlated with premature menopause or gonadal dysgenesis.  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 No evidence of Turner syndrome and it was determined no cardiology referral is needed and this deletion only causes premature ovarian failure .  -Patient with imperfect adherence to COC's, I have recommended CombiPatch to improve her compliance, patient advised to change this twice weekly and to remain with the same days of the week for example Sunday and Wednesday   Medications :  Stop Amethia 0.1-0.02 daily  Start CombiPatch 0.05-0.25 mg/dL twice weekly      2. T2DM:    -She was diagnosed with prediabetes in 2019 with an A1c of 6.0% -Patient developed diabetes in 09/2022 with an A1c of 7.1% -She continues to drink sweet tea, patient advised to avoid sugar sweetened beverages -She has not been on metformin for couple months -She is having GI issues, in the form of diarrhea and bloating -I have recommended GLP-1 agonist, caution against GI side effects, she was  provided with #1 sample pen of Ozempic  Medication  Stop metformin Start Ozempic 0.25 mg once weekly for 6 weeks, then increase to 0.5 mg weekly    F/U in 6 months   Signed electronically by: Lyndle Herrlich, MD  [facility]  [facility] [address] [place] [place], [place] [address] Phone: [phone] FAX: [phone]      CC: Deatra James, MD [address] [place] [address] Phone: [phone]  Fax: [phone]   Return to Endocrinology clinic as below: Future Appointments  Date Time Provider Department Center  10/11/2023  1:30 PM Shawnie Dapper, NP [facility] None

## 2023-10-02 NOTE — Telephone Encounter (Signed)
Medication Samples have been provided to the patient.  Drug name: Ozempic        Strength: 0.25mg         Qty: 1 box   LOT: [id number]  Exp.Date: 01/10/2025  Dosing instructions: inject once weekly   The patient has been instructed regarding the correct time, dose, and frequency of taking this medication, including desired effects and most common side effects.   Jacqueline Olson L Jacqueline Olson 2:12 PM 10/02/2023

## 2023-10-03 ENCOUNTER — Encounter: Payer: Self-pay | Admitting: Internal Medicine

## 2023-10-03 ENCOUNTER — Telehealth: Payer: Self-pay

## 2023-10-03 ENCOUNTER — Other Ambulatory Visit (HOSPITAL_COMMUNITY): Payer: Self-pay

## 2023-10-03 NOTE — Telephone Encounter (Signed)
Pa needed for Ozempic

## 2023-10-05 MED ORDER — METFORMIN HCL ER 500 MG PO TB24: 500.0000 mg | ORAL_TABLET | Freq: Every day | ORAL | 3 refills | Status: DC

## 2023-10-10 NOTE — Progress Notes (Deleted)
No chief complaint on file.   HISTORY OF PRESENT ILLNESS:  10/10/23 ALL:  Jacqueline Olson returns for follow up for headaches. She was last seen 04/2023 and encouraged to start nortriptyline as previously recommended by Dr Delena Bali. Reportedly had difficulty remembering titration schedule with topiramate. She called 06/2023 asking to switch back to topiramate as she did not like that nortriptyline was an antidepressant. We started 50mg  at bedtime. She called 09/2023 reporting continued headaches and dose was increased to 50mg  BID. Since,   04/16/2023 ALL:  Jacqueline Olson is a 22 y.o. female here today for follow up for headaches. She was seen in consult with Dr Delena Bali 01/2022. She was started on topiramate but stopped after about 3 weeks as she couldn't remember titration schedule. Rizatriptan started for abortive therapy but caused chest and throat tightness. She was restarted on topiramate 04/2022 and naratriptan used for abortive therapy. PT referral placed for neck pain. At last visit 09/2022, she had stopped topiramate and felt naratriptan caused throat tightness. She was started on nortriptyline 25mg  QHS and Nurtec PRN. Since, she reports not knowing nortriptyline was called in. She did start Nurtec and felt it was effective. She continues to have daily headaches. She describes 4-5 as being migrainous per month. She is taking Tylenol or ibuprofen every day.   She did not attend PT for neck pain. She reports cancelling the appt. She has been referred back to PT for chest pains and plans to start therapy tomorrow.   Sleep study with [facility] was normal in 2022.   HISTORY (copied from Dr Quentin Mulling previous note)  22 year old female who follows in clinic for migraines. Brain MRI 02/27/22 was unremarkable.   At her list visit she was started on naratriptan for migraine rescue. Topamax was restarted for prevention. She was referred to neck PT.   Interval History: Headaches are no longer daily, but she  continues to have them ~3 days per week. Topamax was not very helpful and she had trouble keeping track of it so she stopped taking it. Naratriptan helps take the edge off but causes chest tightness.      Headache days per month: 12 Headache free days per month: 18   Current Headache Regimen: Preventative: none Abortive: naratriptan 2.5 mg PRN     Prior Therapies                                  Ibuprofen Topamax 100 mg QHS Maxalt 10 mg PRN - chest and throat tightness Naratriptan 2.5 mg PRN - chest and throat tightness   REVIEW OF SYSTEMS: Out of a complete 14 system review of symptoms, the patient complains only of the following symptoms, headaches and all other reviewed systems are negative.   ALLERGIES: No Known Allergies   HOME MEDICATIONS: Outpatient Medications Prior to Visit  Medication Sig Dispense Refill   cetirizine (ZYRTEC) 10 MG tablet Take 10 mg by mouth daily. (Patient not taking: Reported on 10/02/2023)     CREON 36000-114000 units CPEP capsule Take by mouth.     estradiol-norethindrone (COMBIPATCH) 0.05-0.25 MG/DAY Place 1 patch onto the skin 2 (two) times a week. 8 patch 12   hyoscyamine (ANASPAZ) 0.125 MG TBDP disintergrating tablet Take by mouth.     ibuprofen (ADVIL) 600 MG tablet Take 1 tablet (600 mg total) by mouth every 6 (six) hours as needed for moderate pain. 30 tablet 0  meloxicam (MOBIC) 15 MG tablet Take 1 tablet every day by oral route with meal(s) for 30 days.     metFORMIN (GLUCOPHAGE-XR) 500 MG 24 hr tablet Take 1 tablet (500 mg total) by mouth daily with breakfast. 90 tablet 3   naproxen sodium (ANAPROX DS) 550 MG tablet Take 1 tablet (550 mg total) by mouth 2 (two) times daily with a meal. 30 tablet 0   naratriptan (AMERGE) 2.5 MG tablet      omeprazole (PRILOSEC) 40 MG capsule Take by mouth.     Rimegepant Sulfate (NURTEC) 75 MG TBDP Take 1 tablet (75 mg total) by mouth as needed. 8 tablet 6   topiramate (TOPAMAX) 50 MG tablet Take 1  tablet (50 mg total) by mouth daily. 90 tablet 1   Vitamin D, Ergocalciferol, (DRISDOL) 1.25 MG (50000 UNIT) CAPS capsule Take 50,000 Units by mouth once a week.     No facility-administered medications prior to visit.     PAST MEDICAL HISTORY: Past Medical History:  Diagnosis Date   ACL tear 02/05/2016   right knee   Medial meniscus tear 02/05/2016   right knee   Migraines    Vasovagal syncope    Vitamin D deficiency      PAST SURGICAL HISTORY: Past Surgical History:  Procedure Laterality Date   CENTRAL VENOUS CATHETER INSERTION  05-Feb-2001   KNEE ARTHROSCOPY WITH ANTERIOR CRUCIATE LIGAMENT (ACL) REPAIR WITH HAMSTRING GRAFT Right 03/02/2016   Procedure: RIGHT KNEE ARTHROSCOPY,PARTIAL MEDIAL MENISECTOMY WITH ANTERIOR CRUCIATE LIGAMENT (ACL) REPAIR WITH HAMSTRING AUTOGRAFT;  Surgeon: Loreta Ave, MD;  Location: [facility];  Service: Orthopedics;  Laterality: Right;   KNEE ARTHROSCOPY WITH LATERAL MENISECTOMY Right 03/02/2016   Procedure: KNEE ARTHROSCOPY WITH PARTIAL LATERAL MENISECTOMY;  Surgeon: Loreta Ave, MD;  Location: [facility];  Service: Orthopedics;  Laterality: Right;     FAMILY HISTORY: Family History  Problem Relation Age of Onset   Hypertension Mother    Diabetes Father    Hypertension Father    Kidney disease Paternal Grandfather        ESRD/dialysis   Hypertension Paternal Grandfather    Diabetes Paternal Grandfather    Stroke Paternal Grandfather      SOCIAL HISTORY: Social History   Socioeconomic History   Marital status: Single    Spouse name: Not on file   Number of children: 0   Years of education: Not on file   Highest education level: Associate degree: occupational, Scientist, product/process development, or vocational program  Occupational History   Occupation: Hair stylist  Tobacco Use   Smoking status: Never   Smokeless tobacco: Never  Vaping Use   Vaping status: Never Used  Substance and Sexual Activity   Alcohol use: No    Drug use: No   Sexual activity: Not on file  Other Topics Concern   Not on file  Social History Narrative   Lives with parents, siblings   coffee   Social Determinants of Health   Financial Resource Strain: Not on file  Food Insecurity: No Food Insecurity (04/04/2021)   Received from [facility], [facility]   Hunger Vital Sign    Worried About Running Out of Food in the Last Year: Never true    Ran Out of Food in the Last Year: Never true  Transportation Needs: Not on file  Physical Activity: Not on file  Stress: Not on file  Social Connections: Unknown (04/25/2022)   Received [facility], [facility]   Social  Network    Social Network: Not on file  Intimate Partner Violence: Unknown (03/17/2022)   Received from [facility], [facility]   HITS    Physically Hurt: Not on file    Insult or Talk Down To: Not on file    Threaten Physical Harm: Not on file    Scream or Curse: Not on file     PHYSICAL EXAM  There were no vitals filed for this visit.  There is no height or weight on file to calculate BMI.  Generalized: Well developed, in no acute distress  Cardiology: normal rate and rhythm, no murmur auscultated  Respiratory: clear to auscultation bilaterally    Neurological examination  Mentation: Alert oriented to time, place, history taking. Follows all commands speech and language fluent Cranial nerve II-XII: Pupils were equal round reactive to light. Extraocular movements were full, visual field were full on confrontational test. Facial sensation and strength were normal. Uvula tongue midline. Head turning and shoulder shrug  were normal and symmetric. Motor: The motor testing reveals 5 over 5 strength of all 4 extremities. Good symmetric motor tone is noted throughout.  Sensory: Sensory testing is intact to soft touch on all 4 extremities. No evidence of extinction is noted.  Coordination: Cerebellar testing reveals good finger-nose-finger and  heel-to-shin bilaterally.  Gait and station: Gait is normal. Tandem gait is normal. Romberg is negative. No drift is seen.  Reflexes: Deep tendon reflexes are symmetric and normal bilaterally.    DIAGNOSTIC DATA (LABS, IMAGING, TESTING) - I reviewed patient records, labs, notes, testing and imaging myself where available.  Lab Results  Component Value Date   WBC 7.1 09/20/2021   HGB 12.3 09/20/2021   HCT 35.9 (L) 09/20/2021   MCV 82.0 09/20/2021   PLT 422 (H) 09/20/2021      Component Value Date/Time   NA 142 03/24/2022 1534   K 4.7 03/24/2022 1534   CL 103 03/24/2022 1534   CO2 26 03/24/2022 1534   GLUCOSE 77 03/24/2022 1534   GLUCOSE 102 (H) 09/20/2021 1011   BUN 12 03/24/2022 1534   CREATININE 0.73 03/24/2022 1534   CREATININE 0.63 12/13/2017 1538   CALCIUM 9.5 03/24/2022 1534   PROT 7.0 09/20/2021 1011   ALBUMIN 3.4 (L) 09/20/2021 1011   AST 12 (L) 09/20/2021 1011   ALT 10 09/20/2021 1011   ALKPHOS 74 09/20/2021 1011   BILITOT 0.4 09/20/2021 1011   GFRNONAA >60 09/20/2021 1011   GFRAA >60 05/16/2019 0932   Lab Results  Component Value Date   CHOL 203 (H) 03/24/2022   HDL 57 03/24/2022   LDLCALC 134 (H) 03/24/2022   TRIG 68 03/24/2022   CHOLHDL 3.6 03/24/2022   Lab Results  Component Value Date   HGBA1C 6.4 (A) 10/02/2023   No results found for: "VITAMINB12" Lab Results  Component Value Date   TSH 0.990 03/24/2022        No data to display               No data to display           ASSESSMENT AND PLAN  22 y.o. year old female  has a past medical history of ACL tear (02/05/2016), Medial meniscus tear (02/05/2016), Migraines, Vasovagal syncope, and Vitamin D deficiency. here with    No diagnosis found.  Angelita Ingles reports having daily headaches with 4-5 migraines per month. I have encouraged her to start nortriptyline as discussed at last visit. We will continue Nurtec as  needed for abortive therapy. She was encouraged to wean use  of OTC analgesics. Education materials provided. Healthy lifestyle habits encouraged. She will follow up with PCP as directed. She will return to see me in 4-6 months, sooner if needed. She verbalizes understanding and agreement with this plan.   No orders of the defined types were placed in this encounter.    No orders of the defined types were placed in this encounter.    Shawnie Dapper, MSN, FNP-C 10/10/2023, 7:53 AM  [facility] [address] [place], [place] [address] [phone]

## 2023-10-11 ENCOUNTER — Ambulatory Visit: Payer: No Typology Code available for payment source | Admitting: Family Medicine

## 2023-10-15 NOTE — Progress Notes (Unsigned)
No chief complaint on file.   HISTORY OF PRESENT ILLNESS:  10/15/23 ALL:  Corvette returns for follow up for headaches. She was last seen 04/2023 and encouraged to start nortriptyline as previously recommended by Dr Delena Bali. Reportedly had difficulty remembering titration schedule with topiramate. She called 06/2023 asking to switch back to topiramate as she did not like that nortriptyline was an antidepressant. We started 50mg  at bedtime. She called 09/2023 reporting continued headaches and dose was increased to 50mg  BID. Since,   04/16/2023 ALL:  Jacqueline Olson is a 22 y.o. female here today for follow up for headaches. She was seen in consult with Dr Delena Bali 01/2022. She was started on topiramate but stopped after about 3 weeks as she couldn't remember titration schedule. Rizatriptan started for abortive therapy but caused chest and throat tightness. She was restarted on topiramate 04/2022 and naratriptan used for abortive therapy. PT referral placed for neck pain. At last visit 09/2022, she had stopped topiramate and felt naratriptan caused throat tightness. She was started on nortriptyline 25mg  QHS and Nurtec PRN. Since, she reports not knowing nortriptyline was called in. She did start Nurtec and felt it was effective. She continues to have daily headaches. She describes 4-5 as being migrainous per month. She is taking Tylenol or ibuprofen every day.   She did not attend PT for neck pain. She reports cancelling the appt. She has been referred back to PT for chest pains and plans to start therapy tomorrow.   Sleep study with [facility] was normal in 2022.   HISTORY (copied from Dr Quentin Mulling previous note)  22 year old female who follows in clinic for migraines. Brain MRI 02/27/22 was unremarkable.   At her list visit she was started on naratriptan for migraine rescue. Topamax was restarted for prevention. She was referred to neck PT.   Interval History: Headaches are no longer daily, but she  continues to have them ~3 days per week. Topamax was not very helpful and she had trouble keeping track of it so she stopped taking it. Naratriptan helps take the edge off but causes chest tightness.      Headache days per month: 12 Headache free days per month: 18   Current Headache Regimen: Preventative: none Abortive: naratriptan 2.5 mg PRN     Prior Therapies                                  Ibuprofen Topamax 100 mg QHS Maxalt 10 mg PRN - chest and throat tightness Naratriptan 2.5 mg PRN - chest and throat tightness   REVIEW OF SYSTEMS: Out of a complete 14 system review of symptoms, the patient complains only of the following symptoms, headaches and all other reviewed systems are negative.   ALLERGIES: No Known Allergies   HOME MEDICATIONS: Outpatient Medications Prior to Visit  Medication Sig Dispense Refill   cetirizine (ZYRTEC) 10 MG tablet Take 10 mg by mouth daily. (Patient not taking: Reported on 10/02/2023)     CREON 36000-114000 units CPEP capsule Take by mouth.     estradiol-norethindrone (COMBIPATCH) 0.05-0.25 MG/DAY Place 1 patch onto the skin 2 (two) times a week. 8 patch 12   hyoscyamine (ANASPAZ) 0.125 MG TBDP disintergrating tablet Take by mouth.     ibuprofen (ADVIL) 600 MG tablet Take 1 tablet (600 mg total) by mouth every 6 (six) hours as needed for moderate pain. 30 tablet 0  meloxicam (MOBIC) 15 MG tablet Take 1 tablet every day by oral route with meal(s) for 30 days.     metFORMIN (GLUCOPHAGE-XR) 500 MG 24 hr tablet Take 1 tablet (500 mg total) by mouth daily with breakfast. 90 tablet 3   naproxen sodium (ANAPROX DS) 550 MG tablet Take 1 tablet (550 mg total) by mouth 2 (two) times daily with a meal. 30 tablet 0   naratriptan (AMERGE) 2.5 MG tablet      omeprazole (PRILOSEC) 40 MG capsule Take by mouth.     Rimegepant Sulfate (NURTEC) 75 MG TBDP Take 1 tablet (75 mg total) by mouth as needed. 8 tablet 6   topiramate (TOPAMAX) 50 MG tablet Take 1  tablet (50 mg total) by mouth daily. 90 tablet 1   Vitamin D, Ergocalciferol, (DRISDOL) 1.25 MG (50000 UNIT) CAPS capsule Take 50,000 Units by mouth once a week.     No facility-administered medications prior to visit.     PAST MEDICAL HISTORY: Past Medical History:  Diagnosis Date   ACL tear 02/05/2016   right knee   Medial meniscus tear 02/05/2016   right knee   Migraines    Vasovagal syncope    Vitamin D deficiency      PAST SURGICAL HISTORY: Past Surgical History:  Procedure Laterality Date   CENTRAL VENOUS CATHETER INSERTION  Dec 02, 2001   KNEE ARTHROSCOPY WITH ANTERIOR CRUCIATE LIGAMENT (ACL) REPAIR WITH HAMSTRING GRAFT Right 03/02/2016   Procedure: RIGHT KNEE ARTHROSCOPY,PARTIAL MEDIAL MENISECTOMY WITH ANTERIOR CRUCIATE LIGAMENT (ACL) REPAIR WITH HAMSTRING AUTOGRAFT;  Surgeon: Loreta Ave, MD;  Location: [facility];  Service: Orthopedics;  Laterality: Right;   KNEE ARTHROSCOPY WITH LATERAL MENISECTOMY Right 03/02/2016   Procedure: KNEE ARTHROSCOPY WITH PARTIAL LATERAL MENISECTOMY;  Surgeon: Loreta Ave, MD;  Location: [facility];  Service: Orthopedics;  Laterality: Right;     FAMILY HISTORY: Family History  Problem Relation Age of Onset   Hypertension Mother    Diabetes Father    Hypertension Father    Kidney disease Paternal Grandfather        ESRD/dialysis   Hypertension Paternal Grandfather    Diabetes Paternal Grandfather    Stroke Paternal Grandfather      SOCIAL HISTORY: Social History   Socioeconomic History   Marital status: Single    Spouse name: Not on file   Number of children: 0   Years of education: Not on file   Highest education level: Associate degree: occupational, Scientist, product/process development, or vocational program  Occupational History   Occupation: Hair stylist  Tobacco Use   Smoking status: Never   Smokeless tobacco: Never  Vaping Use   Vaping status: Never Used  Substance and Sexual Activity   Alcohol use: No    Drug use: No   Sexual activity: Not on file  Other Topics Concern   Not on file  Social History Narrative   Lives with parents, siblings   coffee   Social Determinants of Health   Financial Resource Strain: Not on file  Food Insecurity: No Food Insecurity (04/04/2021)   Received from [facility], [facility]   Hunger Vital Sign    Worried About Running Out of Food in the Last Year: Never true    Ran Out of Food in the Last Year: Never true  Transportation Needs: Not on file  Physical Activity: Not on file  Stress: Not on file  Social Connections: Unknown (04/25/2022)   Received from [facility], [facility]   Social  Network    Social Network: Not on file  Intimate Partner Violence: Unknown (03/17/2022)   Received from [facility], [facility]   HITS    Physically Hurt: Not on file    Insult or Talk Down To: Not on file    Threaten Physical Harm: Not on file    Scream or Curse: Not on file     PHYSICAL EXAM  There were no vitals filed for this visit.  There is no height or weight on file to calculate BMI.  Generalized: Well developed, in no acute distress  Cardiology: normal rate and rhythm, no murmur auscultated  Respiratory: clear to auscultation bilaterally    Neurological examination  Mentation: Alert oriented to time, place, history taking. Follows all commands speech and language fluent Cranial nerve II-XII: Pupils were equal round reactive to light. Extraocular movements were full, visual field were full on confrontational test. Facial sensation and strength were normal. Uvula tongue midline. Head turning and shoulder shrug  were normal and symmetric. Motor: The motor testing reveals 5 over 5 strength of all 4 extremities. Good symmetric motor tone is noted throughout.  Sensory: Sensory testing is intact to soft touch on all 4 extremities. No evidence of extinction is noted.  Coordination: Cerebellar testing reveals good finger-nose-finger and  heel-to-shin bilaterally.  Gait and station: Gait is normal. Tandem gait is normal. Romberg is negative. No drift is seen.  Reflexes: Deep tendon reflexes are symmetric and normal bilaterally.    DIAGNOSTIC DATA (LABS, IMAGING, TESTING) - I reviewed patient records, labs, notes, testing and imaging myself where available.  Lab Results  Component Value Date   WBC 7.1 09/20/2021   HGB 12.3 09/20/2021   HCT 35.9 (L) 09/20/2021   MCV 82.0 09/20/2021   PLT 422 (H) 09/20/2021      Component Value Date/Time   NA 142 03/24/2022 1534   K 4.7 03/24/2022 1534   CL 103 03/24/2022 1534   CO2 26 03/24/2022 1534   GLUCOSE 77 03/24/2022 1534   GLUCOSE 102 (H) 09/20/2021 1011   BUN 12 03/24/2022 1534   CREATININE 0.73 03/24/2022 1534   CREATININE 0.63 12/13/2017 1538   CALCIUM 9.5 03/24/2022 1534   PROT 7.0 09/20/2021 1011   ALBUMIN 3.4 (L) 09/20/2021 1011   AST 12 (L) 09/20/2021 1011   ALT 10 09/20/2021 1011   ALKPHOS 74 09/20/2021 1011   BILITOT 0.4 09/20/2021 1011   GFRNONAA >60 09/20/2021 1011   GFRAA >60 05/16/2019 0932   Lab Results  Component Value Date   CHOL 203 (H) 03/24/2022   HDL 57 03/24/2022   LDLCALC 134 (H) 03/24/2022   TRIG 68 03/24/2022   CHOLHDL 3.6 03/24/2022   Lab Results  Component Value Date   HGBA1C 6.4 (A) 10/02/2023   No results found for: "VITAMINB12" Lab Results  Component Value Date   TSH 0.990 03/24/2022        No data to display               No data to display           ASSESSMENT AND PLAN  22 y.o. year old female  has a past medical history of ACL tear (02/05/2016), Medial meniscus tear (02/05/2016), Migraines, Vasovagal syncope, and Vitamin D deficiency. here with    No diagnosis found.  Angelita Ingles reports having daily headaches with 4-5 migraines per month. I have encouraged her to start nortriptyline as discussed at last visit. We will continue Nurtec as  needed for abortive therapy. She was encouraged to wean use  of OTC analgesics. Education materials provided. Healthy lifestyle habits encouraged. She will follow up with PCP as directed. She will return to see me in 4-6 months, sooner if needed. She verbalizes understanding and agreement with this plan.   No orders of the defined types were placed in this encounter.    No orders of the defined types were placed in this encounter.    Shawnie Dapper, MSN, FNP-C 10/15/2023, 10:11 AM  [facility] [address], [address] [place], [place] [address] [phone]

## 2023-10-15 NOTE — Patient Instructions (Signed)
Below is our plan:  We will continue topiramate 50mg  daily. Set an alarm to remember to take it consistently. Continue Nurtec as needed. Do not get pregnant on these medicaitons.   Journal headaches. We can increase dose if needed.   Please make sure you are staying well hydrated. I recommend 50-60 ounces daily. Well balanced diet and regular exercise encouraged. Consistent sleep schedule with 6-8 hours recommended.   Please continue follow up with care team as directed.   Follow up with me in 1 year  You may receive a survey regarding today's visit. I encourage you to leave honest feed back as I do use this information to improve patient care. Thank you for seeing me today!   GENERAL HEADACHE INFORMATION:   Natural supplements: Magnesium Oxide or Magnesium Glycinate 500 mg at bed (up to 800 mg daily) Coenzyme Q10 300 mg in AM Vitamin B2- 200 mg twice a day   Add 1 supplement at a time since even natural supplements can have undesirable side effects. You can sometimes buy supplements cheaper (especially Coenzyme Q10) at www.WebmailGuide.co.za or at [facility].  Migraine with aura: There is increased risk for stroke in women with migraine with aura and a contraindication for the combined contraceptive pill for use by women who have migraine with aura. The risk for women with migraine without aura is lower. However other risk factors like smoking are far more likely to increase stroke risk than migraine. There is a recommendation for no smoking and for the use of OCPs without estrogen such as progestogen only pills particularly for women with migraine with aura.Marland Kitchen People who have migraine headaches with auras may be 3 times more likely to have a stroke caused by a blood clot, compared to migraine patients who don't see auras. Women who take hormone-replacement therapy may be 30 percent more likely to suffer a clot-based stroke than women not taking medication containing estrogen. Other risk factors like  smoking and high blood pressure may be  much more important.    Vitamins and herbs that show potential:   Magnesium: Magnesium (250 mg twice a day or 500 mg at bed) has a relaxant effect on smooth muscles such as blood vessels. Individuals suffering from frequent or daily headache usually have low magnesium levels which can be increase with daily supplementation of 400-750 mg. Three trials found 40-90% average headache reduction  when used as a preventative. Magnesium may help with headaches are aura, the best evidence for magnesium is for migraine with aura is its thought to stop the cortical spreading depression we believe is the pathophysiology of migraine aura.Magnesium also demonstrated the benefit in menstrually related migraine.  Magnesium is part of the messenger system in the serotonin cascade and it is a good muscle relaxant.  It is also useful for constipation which can be a side effect of other medications used to treat migraine. Good sources include nuts, whole grains, and tomatoes. Side Effects: loose stool/diarrhea  Riboflavin (vitamin B 2) 200 mg twice a day. This vitamin assists nerve cells in the production of ATP a principal energy storing molecule.  It is necessary for many chemical reactions in the body.  There have been at least 3 clinical trials of riboflavin using 400 mg per day all of which suggested that migraine frequency can be decreased.  All 3 trials showed significant improvement in over half of migraine sufferers.  The supplement is found in bread, cereal, milk, meat, and poultry.  Most Americans get more riboflavin  than the recommended daily allowance, however riboflavin deficiency is not necessary for the supplements to help prevent headache. Side effects: energizing, green urine   Coenzyme Q10: This is present in almost all cells in the body and is critical component for the conversion of energy.  Recent studies have shown that a nutritional supplement of CoQ10 can reduce  the frequency of migraine attacks by improving the energy production of cells as with riboflavin.  Doses of 150 mg twice a day have been shown to be effective.   Melatonin: Increasing evidence shows correlation between melatonin secretion and headache conditions.  Melatonin supplementation has decreased headache intensity and duration.  It is widely used as a sleep aid.  Sleep is natures way of dealing with migraine.  A dose of 3 mg is recommended to start for headaches including cluster headache. Higher doses up to 15 mg has been reviewed for use in Cluster headache and have been used. The rationale behind using melatonin for cluster is that many theories regarding the cause of Cluster headache center around the disruption of the normal circadian rhythm in the brain.  This helps restore the normal circadian rhythm.   HEADACHE DIET: Foods and beverages which may trigger migraine Note that only 20% of headache patients are food sensitive. You will know if you are food sensitive if you get a headache consistently 20 minutes to 2 hours after eating a certain food. Only cut out a food if it causes headaches, otherwise you might remove foods you enjoy! What matters most for diet is to eat a well balanced healthy diet full of vegetables and low fat protein, and to not miss meals.   Chocolate, other sweets ALL cheeses except cottage and cream cheese Dairy products, yogurt, sour cream, ice cream Liver Meat extracts (Bovril, Marmite, meat tenderizers) Meats or fish which have undergone aging, fermenting, pickling or smoking. These include: Hotdogs,salami,Lox,sausage, mortadellas,smoked salmon, pepperoni, Pickled herring Pods of broad bean (English beans, Chinese pea pods, [place] (fava) beans, lima and navy beans Ripe avocado, ripe banana Yeast extracts or active yeast preparations such as Brewer's or Fleishman's (commercial bakes goods are permitted) Tomato based foods, pizza (lasagna, etc.)   MSG  (monosodium glutamate) is disguised as many things; look for these common aliases: Monopotassium glutamate Autolysed yeast Hydrolysed protein Sodium caseinate "flavorings" "all natural preservatives" Nutrasweet   Avoid all other foods that convincingly provoke headaches.   Resources: The Dizzy [place]air Laundry Your Headache Diet, migrainestrong.com  https://zamora-andrews.com/   Caffeine and Migraine For patients that have migraine, caffeine intake more than 3 days per week can lead to dependency and increased migraine frequency. I would recommend cutting back on your caffeine intake as best you can. The recommended amount of caffeine is 200-300 mg daily, although migraine patients may experience dependency at even lower doses. While you may notice an increase in headache temporarily, cutting back will be helpful for headaches in the long run. For more information on caffeine and migraine, visit: https://americanmigrainefoundation.org/resource-library/caffeine-and-migraine/   Headache Prevention Strategies:   1. Maintain a headache diary; learn to identify and avoid triggers.  - This can be a simple note where you log when you had a headache, associated symptoms, and medications used - There are several smartphone apps developed to help track migraines: Migraine Buddy, Migraine Monitor, Curelator N1-Headache App   Common triggers include: Emotional triggers: Emotional/Upset family or friends Emotional/Upset occupation Business reversal/success Anticipation anxiety Crisis-serious Post-crisis periodNew job/position   Physical triggers: Vacation Day Weekend Strenuous Exercise High Altitude  Location New Move Menstrual Day Physical Illness Oversleep/Not enough sleep Weather changes Light: Photophobia or light sesnitivity treatment involves a balance between desensitization and reduction in overly strong input. Use dark polarized glasses  outside, but not inside. Avoid bright or fluorescent light, but do not dim environment to the point that going into a normally lit room hurts. Consider FL-41 tint lenses, which reduce the most irritating wavelengths without blocking too much light.  These can be obtained at axonoptics.com or theraspecs.com Foods: see list above.   2. Limit use of acute treatments (over-the-counter medications, triptans, etc.) to no more than 2 days per week or 10 days per month to prevent medication overuse headache (rebound headache).     3. Follow a regular schedule (including weekends and holidays): Don't skip meals. Eat a balanced diet. 8 hours of sleep nightly. Minimize stress. Exercise 30 minutes per day. Being overweight is associated with a 5 times increased risk of chronic migraine. Keep well hydrated and drink 6-8 glasses of water per day.   4. Initiate non-pharmacologic measures at the earliest onset of your headache. Rest and quiet environment. Relax and reduce stress. Breathe2Relax is a free app that can instruct you on    some simple relaxtion and breathing techniques. Http://Dawnbuse.com is a    free website that provides teaching videos on relaxation.  Also, there are  many apps that   can be downloaded for "mindful" relaxation.  An app called YOGA NIDRA will help walk you through mindfulness. Another app called Calm can be downloaded to give you a structured mindfulness guide with daily reminders and skill development. Headspace for guided meditation Mindfulness Based Stress Reduction Online Course: www.palousemindfulness.com Cold compresses.   5. Don't wait!! Take the maximum allowable dosage of prescribed medication at the first sign of migraine.   6. Compliance:  Take prescribed medication regularly as directed and at the first sign of a migraine.   7. Communicate:  Call your physician when problems arise, especially if your headaches change, increase in frequency/severity, or become  associated with neurological symptoms (weakness, numbness, slurred speech, etc.). Proceed to emergency room if you experience new or worsening symptoms or symptoms do not resolve, if you have new neurologic symptoms or if headache is severe, or for any concerning symptom.   8. Headache/pain management therapies: Consider various complementary methods, including medication, behavioral therapy, psychological counselling, biofeedback, massage therapy, acupuncture, dry needling, and other modalities.  Such measures may reduce the need for medications. Counseling for pain management, where patients learn to function and ignore/minimize their pain, seems to work very well.   9. Recommend changing family's attention and focus away from patient's headaches. Instead, emphasize daily activities. If first question of day is 'How are your headaches/Do you have a headache today?', then patient will constantly think about headaches, thus making them worse. Goal is to re-direct attention away from headaches, toward daily activities and other distractions.   10. Helpful Websites: www.AmericanHeadacheSociety.org PatentHood.ch www.headaches.org TightMarket.nl www.achenet.org

## 2023-10-17 ENCOUNTER — Encounter: Payer: Self-pay | Admitting: Family Medicine

## 2023-10-17 ENCOUNTER — Ambulatory Visit (INDEPENDENT_AMBULATORY_CARE_PROVIDER_SITE_OTHER): Payer: No Typology Code available for payment source | Admitting: Family Medicine

## 2023-10-17 VITALS — BP 123/76 | HR 93 | Ht 61.0 in | Wt 248.4 lb

## 2023-10-17 DIAGNOSIS — G43009 Migraine without aura, not intractable, without status migrainosus: Secondary | ICD-10-CM | POA: Diagnosis not present

## 2023-10-17 MED ORDER — TOPIRAMATE 50 MG PO TABS: 50.0000 mg | ORAL_TABLET | Freq: Every day | ORAL | 3 refills | Status: DC

## 2023-10-17 MED ORDER — NURTEC 75 MG PO TBDP: 75.0000 mg | ORAL_TABLET | ORAL | 11 refills | Status: AC | PRN

## 2023-12-28 ENCOUNTER — Encounter (INDEPENDENT_AMBULATORY_CARE_PROVIDER_SITE_OTHER): Payer: Self-pay | Admitting: Otolaryngology

## 2024-01-17 ENCOUNTER — Encounter: Payer: Self-pay | Admitting: Internal Medicine

## 2024-01-17 ENCOUNTER — Ambulatory Visit: Payer: No Typology Code available for payment source | Admitting: Internal Medicine

## 2024-01-17 VITALS — BP 126/84 | HR 87 | Ht 61.0 in | Wt 250.0 lb

## 2024-01-17 DIAGNOSIS — Q991 46, XX true hermaphrodite: Secondary | ICD-10-CM | POA: Diagnosis not present

## 2024-01-17 DIAGNOSIS — Z7984 Long term (current) use of oral hypoglycemic drugs: Secondary | ICD-10-CM

## 2024-01-17 DIAGNOSIS — E119 Type 2 diabetes mellitus without complications: Secondary | ICD-10-CM | POA: Diagnosis not present

## 2024-01-17 LAB — POCT GLYCOSYLATED HEMOGLOBIN (HGB A1C): Hemoglobin A1C: 6.7 % — AB (ref 4.0–5.6)

## 2024-01-17 LAB — POCT GLUCOSE (DEVICE FOR HOME USE): POC Glucose: 142 mg/dL — AB (ref 70–99)

## 2024-01-17 MED ORDER — METFORMIN HCL ER 500 MG PO TB24: 500.0000 mg | ORAL_TABLET | Freq: Every day | ORAL | 3 refills | Status: DC

## 2024-01-17 MED ORDER — COMBIPATCH 0.05-0.25 MG/DAY TD PTTW: 1.0000 | MEDICATED_PATCH | TRANSDERMAL | 12 refills | Status: DC

## 2024-01-17 NOTE — Progress Notes (Signed)
 Name: Jacqueline Olson  MRN/ DOB: [id number], [birth year]    Age/ Sex: 23 y.o., female     PCP: Sun, Vyvyan, MD   Reason for Endocrinology Evaluation: Premature Ovarian Failure     Initial Endocrinology Clinic Visit: 11/17/2019    PATIENT IDENTIFIER: Jacqueline Olson is a 23 y.o., female with a past medical history of Prediabetes, obesity and premature ovarian failure  . She has followed with [facility] since 11/17/2019 for consultative assistance with management of her Premature ovarian failure.   HISTORICAL SUMMARY: The patient was first diagnosed with premature ovarian failure during evaluation of irregular menses in 04/2019 with an FSH of 58 mIU/mL and LH 34.3 mIU/mL  With an estradiol of 11.2 pg/mL. She was started on Combined OCP . 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   Per pt she had menarche at age 62. Her menstruations have been regular until the age of 37 when her periods have become irregular.  She was also started on metformin  for pre-diabetes  Graduated cosmetology school  Father with diabetes.  Maternal aunt with thyroid  disease.    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 No evidence of Turner syndrome and it was determined no cardiology referral is needed and this deletion only causes premature ovarian failure .  No Hx of premature ovarian failure in the family   Switch from oral COC's to a CombiPatch  09/2023  DIABETES HISTORY :  She was initially diagnosed with prediabetes in 2019, but her A1c increased to 7.1% in 09/2022. She was on metformin   Attempted to prescribe Ozempic  09/2023 but her insurance declined, requiring step therapy and was restarted on metformin  but she continues with nonadherence Sister with clotting disorders/PE  , has lupus   SUBJECTIVE:   During the last visit (10/02/2023):  Today  (01/17/24): Jacqueline Olson is here for follow-up on diabetes and premature ovarian failure management.  She checks her blood sugars 0 times daily.   She continues to follow up  by GI for IBS Continues with  rare hot flashes  Deniea acne  Denies hirsutism  Denies spotting     HOME ENDOCRINE REGIMEN:  Metformin  500 mg XR daily - not taking  CombiPatch  0.05-0.25 mg/dL twice weekly Monday and  Thursday     Statin: no ACE-I/ARB: no   METER DOWNLOAD SUMMARY:    DIABETIC COMPLICATIONS: Microvascular complications:   Denies: CKD, retinopthy Last Eye Exam: Completed   Macrovascular complications:   Denies: CAD, CVA, PVD      HISTORY:  Past Medical History:  Past Medical History:  Diagnosis Date   ACL tear 02/05/2016   right knee   Medial meniscus tear 02/05/2016   right knee   Migraines    Vasovagal syncope    Vitamin D deficiency    Past Surgical History:  Past Surgical History:  Procedure Laterality Date   CENTRAL VENOUS CATHETER INSERTION  05-03-2001   KNEE ARTHROSCOPY WITH ANTERIOR CRUCIATE LIGAMENT (ACL) REPAIR WITH HAMSTRING GRAFT Right 03/02/2016   Procedure: RIGHT KNEE ARTHROSCOPY,PARTIAL MEDIAL MENISECTOMY WITH ANTERIOR CRUCIATE LIGAMENT (ACL) REPAIR WITH HAMSTRING AUTOGRAFT;  Surgeon: Toribio JULIANNA Chancy, MD;  Location: [facility];  Service: Orthopedics;  Laterality: Right;   KNEE ARTHROSCOPY WITH LATERAL MENISECTOMY Right 03/02/2016   Procedure: KNEE ARTHROSCOPY WITH PARTIAL LATERAL MENISECTOMY;  Surgeon: Toribio JULIANNA Chancy, MD;  Location: [facility];  Service: Orthopedics;  Laterality: Right;   Social History:  reports that she has never smoked. She has never used smokeless tobacco. She reports that she does not drink alcohol and does not use drugs. Family History:  Family History  Problem Relation Age of Onset   Hypertension Mother    Diabetes Father    Hypertension Father    Kidney disease Paternal Grandfather         ESRD/dialysis   Hypertension Paternal Grandfather    Diabetes Paternal Grandfather    Stroke Paternal Grandfather      HOME MEDICATIONS: Allergies as of 01/17/2024   No Known Allergies      Medication List        Accurate as of January 17, 2024  9:16 AM. If you have any questions, ask your nurse or doctor.          cetirizine 10 MG tablet Commonly known as: ZYRTEC Take 10 mg by mouth daily.   CombiPatch  0.05-0.25 MG/DAY Generic drug: estradiol-norethindrone  Place 1 patch onto the skin 2 (two) times a week.   famotidine 40 MG tablet Commonly known as: PEPCID Take by mouth.   hyoscyamine 0.125 MG Tbdp disintergrating tablet Commonly known as: ANASPAZ Take by mouth.   ibuprofen  600 MG tablet Commonly known as: ADVIL  Take 1 tablet (600 mg total) by mouth every 6 (six) hours as needed for moderate pain.   metFORMIN  500 MG 24 hr tablet Commonly known as: GLUCOPHAGE -XR Take 1 tablet (500 mg total) by mouth daily with breakfast.   nortriptyline  25 MG capsule Commonly known as: PAMELOR  1 capsule Orally Once a day   Nurtec 75 MG Tbdp Generic drug: Rimegepant Sulfate Take 1 tablet (75 mg total) by mouth as needed.   omeprazole 40 MG capsule Commonly known as: PRILOSEC Take by mouth.   topiramate  50 MG tablet Commonly known as: Topamax  Take 1 tablet (50 mg total) by mouth daily.   Vitamin D (Ergocalciferol) 1.25 MG (50000 UNIT) Caps capsule Commonly known as: DRISDOL Take 50,000 Units by mouth once a week.          OBJECTIVE:   PHYSICAL EXAM: VS: BP 126/84 (BP Location: Left Arm, Patient Position: Sitting, Cuff Size: Normal)   Pulse 87   Ht 5' 1 (1.549 m)   Wt 250 lb (113.4 kg)   SpO2 98%   BMI 47.24 kg/m    EXAM: General: Pt appears well and is in NAD  Lungs: Clear with good BS bilat   Heart: Auscultation: RRR.  Abdomen: Normoactive bowel sounds, soft, nontender, without masses or organomegaly palpable  Extremities:  BL LE: No pretibial  edema  Mental Status: Judgment, insight: Intact Orientation: Oriented to time, place, and person Mood and affect: No depression, anxiety, or agitation   DM Foot Exam 01/17/2024  The skin of the feet is intact without sores or ulcerations. The pedal pulses are 2+ on right and 2+ on left. The sensation is intact to a screening 5.07, 10 gram monofilament bilaterally   DATA REVIEWED: 05/25/2023  BUN 7 A1c 6.3% CR 0.7 GFR 125 HDL 47 LDL 148 Triglycerides 84 Potassium 4.5    Thyroid  ultrasound 03/27/2022  Normal sized and appearing thyroid  without discrete nodule or mass, similar to the 09/2020 examination.     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 Deletions of  the X long arm have correlated with premature menopause or gonadal dysgenesis.  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 No evidence of Turner syndrome and it was determined no cardiology referral is needed and this deletion only causes premature ovarian failure .  -Due to  imperfect adherence to COC's, we switched to the  CombiPatch    Medications : Continue  CombiPatch  0.05-0.25 mg/dL twice weekly    2) Type 2 Diabetes Mellitus, Optimally controlled, Without complications - Most recent A1c of 6.7 %. Goal A1c < 7.0 %.    -A1c remains at goal -We again emphasized the importance of low-carb diet, avoiding sugar sweetened beverages, and regular exercise -She has not started metformin , I have encouraged her to stop metformin  and notify us  with any adverse effect -Insurance did not cover Ozempic  without  a step therapy with metformin   MEDICATIONS: Metformin  500 mg XR daily   EDUCATION / INSTRUCTIONS: BG monitoring instructions: Patient is instructed to check her blood sugars 2-3 times a week. Call [facility] if: BG persistently < 70  I reviewed the Rule of 15 for the treatment of hypoglycemia in detail with the patient. Literature supplied.    2) Diabetic complications:  Eye: Does not have known diabetic retinopathy.  Neuro/ Feet: Does not have known diabetic peripheral neuropathy .  Renal: Patient does not have known baseline CKD. She   is not on an ACEI/ARB at present.      F/U in 6 months   Signed electronically by: Stefano Redgie Butts, MD  [facility]  [facility] [address] [place], [place] [address] Phone: [phone] FAX: [phone]      CC: Austin Nutley, MD [address] [place] [address] Phone: [phone]  Fax: [phone]   Return to Endocrinology clinic as below: Future Appointments  Date Time Provider Department Center  02/08/2024  1:45 PM [address] [facility] None  10/16/2024  1:30 PM Lomax, Amy, NP [facility] None

## 2024-01-31 IMAGING — DX DG FINGER MIDDLE 2+V*R*
3 series · 3 of 3 positions shown · non-contrast
Comparison: None.

CLINICAL DATA: Trauma, pain and swelling

EXAM:
RIGHT MIDDLE FINGER 3 v

[finger ap]
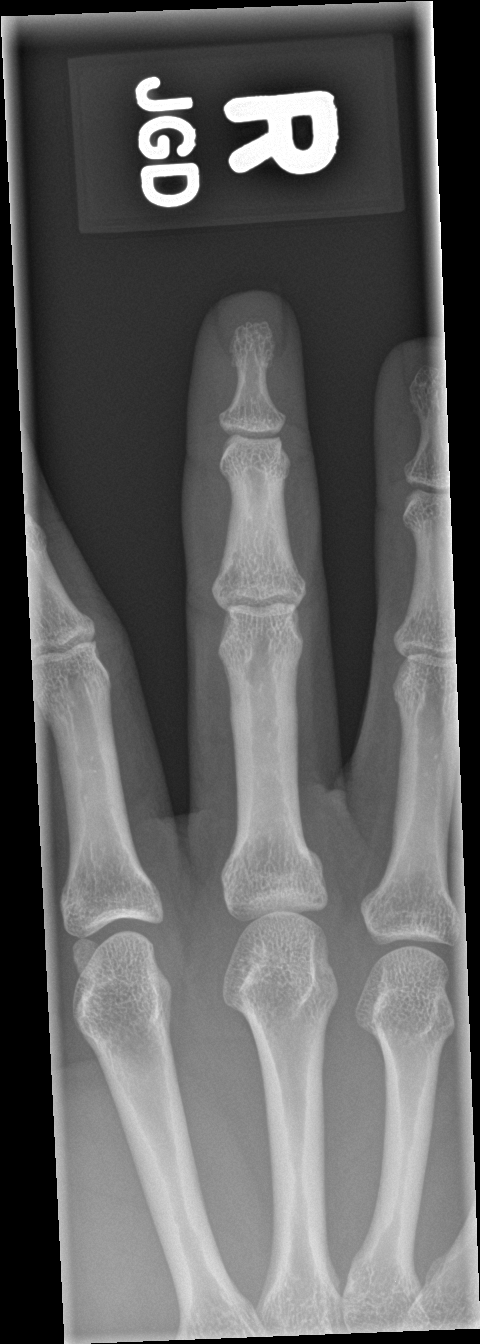

[finger obl]
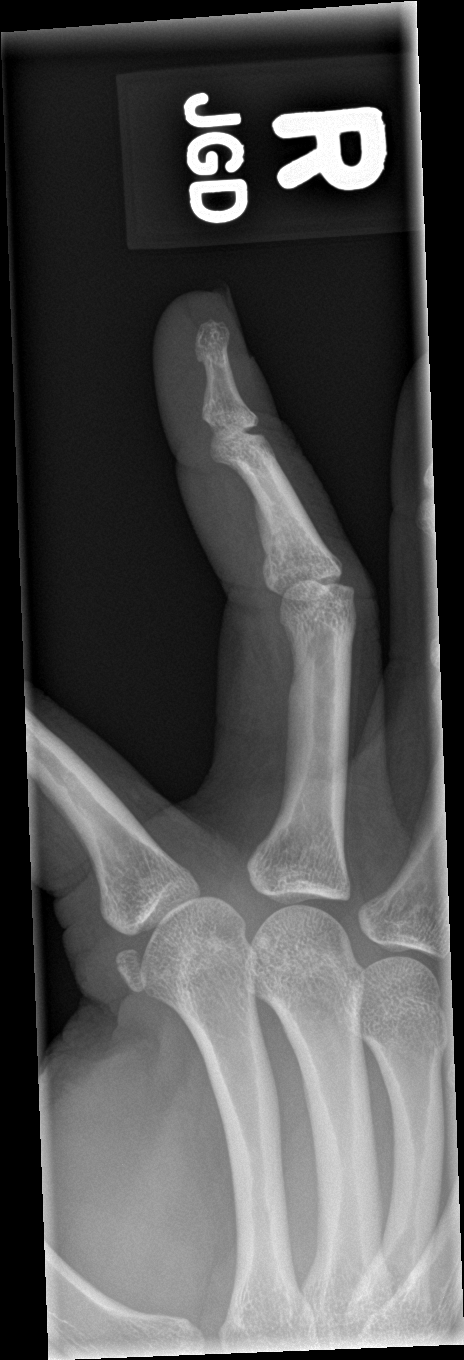

[finger lat]
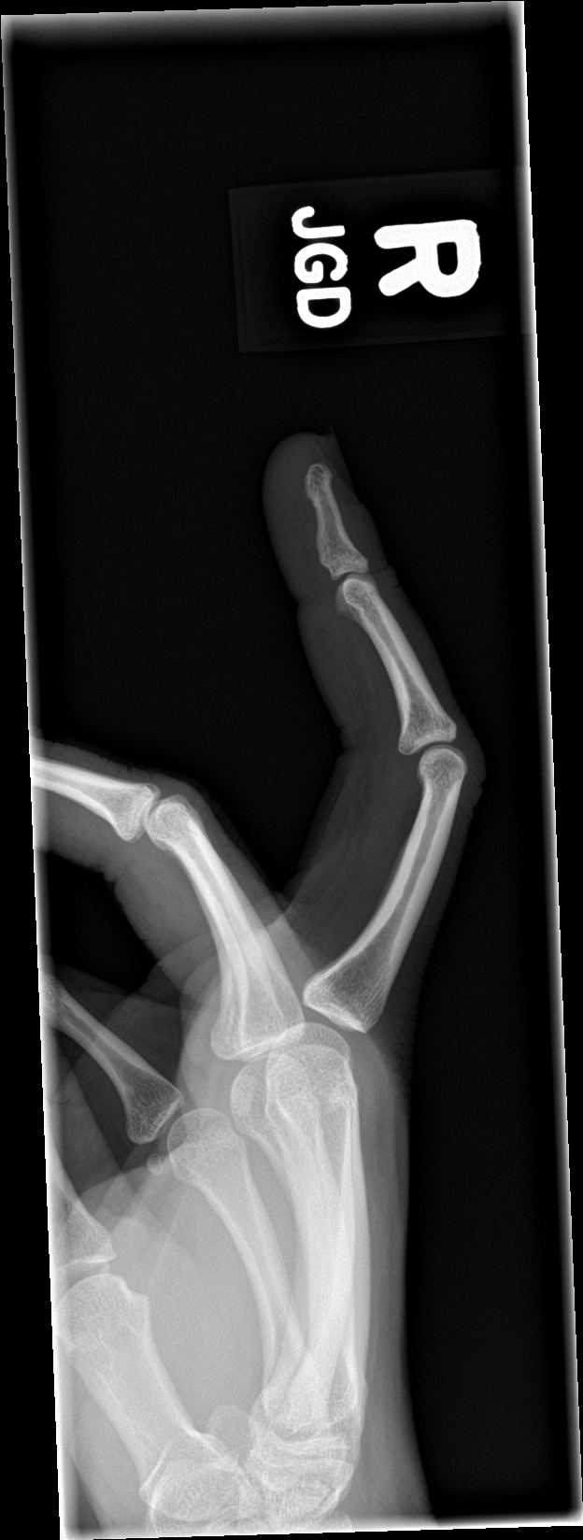

[3 of 3 positions shown; findings below may reference images not displayed]

FINDINGS: No recent fracture or dislocation is seen. There are no opaque
foreign bodies.
IMPRESSION: No fracture or dislocation is seen in the right middle finger.

## 2024-02-07 ENCOUNTER — Telehealth (INDEPENDENT_AMBULATORY_CARE_PROVIDER_SITE_OTHER): Payer: Self-pay | Admitting: Physician Assistant

## 2024-02-07 NOTE — Telephone Encounter (Signed)
 Confirmed appt & location [id number] afm

## 2024-02-08 ENCOUNTER — Ambulatory Visit (INDEPENDENT_AMBULATORY_CARE_PROVIDER_SITE_OTHER): Payer: No Typology Code available for payment source | Admitting: Physician Assistant

## 2024-02-08 VITALS — BP 141/90 | HR 93 | Ht 61.0 in | Wt 243.0 lb

## 2024-02-08 DIAGNOSIS — H938X3 Other specified disorders of ear, bilateral: Secondary | ICD-10-CM | POA: Diagnosis not present

## 2024-02-08 NOTE — Progress Notes (Signed)
 Dear Dr. Azucena Cecil, Here is my assessment for our mutual patient, Jacqueline Olson. Thank you for allowing me the opportunity to care for your patient. Please do not hesitate to contact me should you have any other questions. Sincerely, Burna Forts PA-C  Otolaryngology Clinic Note Referring provider: Dr. Azucena Cecil HPI:  Jacqueline Olson is a 23 y.o. female kindly referred by Dr. Azucena Cecil   The patient is a 23 year old female seen in our office for evaluation of ear fullness.  She notes that in November she felt like both of her ears were filled with fluid.  She did have an upper respiratory infection at that time and was seen and evaluated and told she had fluid in the ears.  There is no antibiotic prescribed given this was viral in nature.  She notes things got better with time.  She notes that occasionally she will have a sharp pain in her right ear the last for several seconds and then goes away, she will have days where she has no pain whatsoever.  She notes that it feels like she needs to pop her ears but this does not help.  No associated clicking or popping, she notes occasionally some ringing that is not persistent.  As a child she had no significant ear issues or recurrent ear infections, no hearing issues, no trauma, no head or neck surgery.  She notes a history of seasonal allergies but she does not take her medication consistently.  She notes currently she has some nasal congestion from a cold she had last week.  She has been using Flonase for the last 2 weeks.  She does not smoke and does not use alcohol.  Currently she is unemployed.   Independent Review of Additional Tests or Records:  none   PMH/Meds/All/SocHx/FamHx/ROS:   Past Medical History:  Diagnosis Date   ACL tear 02/05/2016   right knee   Medial meniscus tear 02/05/2016   right knee   Migraines    Vasovagal syncope    Vitamin D deficiency      Past Surgical History:  Procedure Laterality Date   CENTRAL VENOUS CATHETER  INSERTION  04-21-2001   KNEE ARTHROSCOPY WITH ANTERIOR CRUCIATE LIGAMENT (ACL) REPAIR WITH HAMSTRING GRAFT Right 03/02/2016   Procedure: RIGHT KNEE ARTHROSCOPY,PARTIAL MEDIAL MENISECTOMY WITH ANTERIOR CRUCIATE LIGAMENT (ACL) REPAIR WITH HAMSTRING AUTOGRAFT;  Surgeon: Loreta Ave, MD;  Location: [facility];  Service: Orthopedics;  Laterality: Right;   KNEE ARTHROSCOPY WITH LATERAL MENISECTOMY Right 03/02/2016   Procedure: KNEE ARTHROSCOPY WITH PARTIAL LATERAL MENISECTOMY;  Surgeon: Loreta Ave, MD;  Location: [facility];  Service: Orthopedics;  Laterality: Right;    Family History  Problem Relation Age of Onset   Hypertension Mother    Diabetes Father    Hypertension Father    Kidney disease Paternal Grandfather        ESRD/dialysis   Hypertension Paternal Grandfather    Diabetes Paternal Grandfather    Stroke Paternal Grandfather      Social Connections: Unknown (04/25/2022)   Received from [facility], [facility]   Social Network    Social Network: Not on file      Current Outpatient Medications:    cetirizine (ZYRTEC) 10 MG tablet, Take 10 mg by mouth daily., Disp: , Rfl:    estradiol-norethindrone (COMBIPATCH) 0.05-0.25 MG/DAY, Place 1 patch onto the skin 2 (two) times a week., Disp: 8 patch, Rfl: 12   famotidine (PEPCID) 40 MG tablet, Take by mouth., Disp: , Rfl:  hyoscyamine (ANASPAZ) 0.125 MG TBDP disintergrating tablet, Take by mouth., Disp: , Rfl:    metFORMIN (GLUCOPHAGE-XR) 500 MG 24 hr tablet, Take 1 tablet (500 mg total) by mouth daily with breakfast., Disp: 90 tablet, Rfl: 3   Rimegepant Sulfate (NURTEC) 75 MG TBDP, Take 1 tablet (75 mg total) by mouth as needed., Disp: 8 tablet, Rfl: 11   topiramate (TOPAMAX) 50 MG tablet, Take 1 tablet (50 mg total) by mouth daily., Disp: 90 tablet, Rfl: 3   Vitamin D, Ergocalciferol, (DRISDOL) 1.25 MG (50000 UNIT) CAPS capsule, Take 50,000 Units by mouth once a week., Disp: , Rfl:     ibuprofen (ADVIL) 600 MG tablet, Take 1 tablet (600 mg total) by mouth every 6 (six) hours as needed for moderate pain. (Patient not taking: Reported on 02/08/2024), Disp: 30 tablet, Rfl: 0   nortriptyline (PAMELOR) 25 MG capsule, 1 capsule Orally Once a day, Disp: , Rfl:    omeprazole (PRILOSEC) 40 MG capsule, Take by mouth., Disp: , Rfl:    Physical Exam:   BP (!) 141/90 (BP Location: Left Wrist, Patient Position: Sitting, Cuff Size: Small)   Pulse 93   Ht 5\' 1"  (1.549 m)   Wt 243 lb (110.2 kg)   SpO2 97%   BMI 45.91 kg/m   Pertinent Findings  CN II-XII intact  Bilateral EAC clear and TM intact with bilateral effusions Weber 512: equal Rinne 512: AC > BC b/l  Anterior rhinoscopy: Septum midline; bilateral inferior turbinates with minimal hypertophy No lesions of oral cavity/oropharynx; dentition wnl No obviously palpable neck masses/lymphadenopathy/thyromegaly No respiratory distress or stridor  Seprately Identifiable Procedures:  None  Impression & Plans:  Jacqueline Olson is a 23 y.o. female with the following   Ear fullness-  The patient presents today with complaints of ear fullness.  She does appear to have effusions on her exam.  She recently got over a upper respiratory infection, this is likely the source of her symptoms.  I have recommended that she continue using the Flonase and Zyrtec, I would like to see her back in our office in 2 to 3 months for repeat evaluation and an audiogram to assess any hearing deficits or persistent fluid.  I would like to see her back in the office sooner as needed.  She verbalized understanding and agreement to today's plan had no further questions or concerns.   - f/u 2 to 3 months with repeat evaluation and audiology evaluation   Thank you for allowing me the opportunity to care for your patient. Please do not hesitate to contact me should you have any other questions.  Sincerely, Burna Forts PA-C [facility] Phone:  [phone] Fax: [phone]  02/08/2024, 2:06 PM

## 2024-02-11 ENCOUNTER — Encounter (INDEPENDENT_AMBULATORY_CARE_PROVIDER_SITE_OTHER): Payer: Self-pay

## 2024-02-12 ENCOUNTER — Encounter: Payer: Self-pay | Admitting: Internal Medicine

## 2024-02-13 ENCOUNTER — Telehealth: Payer: Self-pay

## 2024-02-13 ENCOUNTER — Telehealth: Payer: Self-pay | Admitting: Internal Medicine

## 2024-02-13 ENCOUNTER — Other Ambulatory Visit (HOSPITAL_COMMUNITY): Payer: Self-pay

## 2024-02-13 MED ORDER — SEMAGLUTIDE(0.25 OR 0.5MG/DOS) 2 MG/3ML ~~LOC~~ SOPN: 0.5000 mg | PEN_INJECTOR | SUBCUTANEOUS | 11 refills | Status: DC

## 2024-02-13 NOTE — Telephone Encounter (Signed)
 Can you please with PA for Ozempic ?   In the past it was denied because she wasnn't on Metformin but now she is having stomach upset with Metformin    Thanks

## 2024-02-13 NOTE — Telephone Encounter (Signed)
 Pharmacy Patient Advocate Encounter   Received notification from Pt Calls Messages that prior authorization for Ozempic is required/requested.   Insurance verification completed.   The patient is insured through [facility] .   Per test claim: PA required; PA submitted to above mentioned insurance via CoverMyMeds Key/confirmation #/EOC [id number] Status is pending

## 2024-02-25 NOTE — Telephone Encounter (Signed)
 Pharmacy Patient Advocate Encounter  Received notification from [facility] that Prior Authorization for Ozempic has been APPROVED through 02/12/2025   PA #/Case ID/Reference #: [id number]

## 2024-03-30 ENCOUNTER — Encounter: Payer: Self-pay | Admitting: Internal Medicine

## 2024-04-03 ENCOUNTER — Telehealth (INDEPENDENT_AMBULATORY_CARE_PROVIDER_SITE_OTHER): Payer: Self-pay | Admitting: Physician Assistant

## 2024-04-03 NOTE — Telephone Encounter (Signed)
 LVM to return call to reschedule 04/10/2024 appointments

## 2024-04-10 ENCOUNTER — Ambulatory Visit (INDEPENDENT_AMBULATORY_CARE_PROVIDER_SITE_OTHER): Payer: No Typology Code available for payment source

## 2024-04-10 ENCOUNTER — Ambulatory Visit (INDEPENDENT_AMBULATORY_CARE_PROVIDER_SITE_OTHER): Payer: No Typology Code available for payment source | Admitting: Audiology

## 2024-04-11 ENCOUNTER — Ambulatory Visit (INDEPENDENT_AMBULATORY_CARE_PROVIDER_SITE_OTHER)

## 2024-04-11 ENCOUNTER — Ambulatory Visit (INDEPENDENT_AMBULATORY_CARE_PROVIDER_SITE_OTHER): Admitting: Audiology

## 2024-04-22 ENCOUNTER — Other Ambulatory Visit: Payer: Self-pay

## 2024-04-22 ENCOUNTER — Encounter: Payer: Self-pay | Admitting: Internal Medicine

## 2024-04-22 MED ORDER — COMBIPATCH 0.05-0.25 MG/DAY TD PTTW: 1.0000 | MEDICATED_PATCH | TRANSDERMAL | 12 refills | Status: DC

## 2024-04-29 ENCOUNTER — Ambulatory Visit (INDEPENDENT_AMBULATORY_CARE_PROVIDER_SITE_OTHER): Admitting: Audiology

## 2024-04-29 ENCOUNTER — Ambulatory Visit (INDEPENDENT_AMBULATORY_CARE_PROVIDER_SITE_OTHER): Admitting: Physician Assistant

## 2024-04-29 ENCOUNTER — Encounter (INDEPENDENT_AMBULATORY_CARE_PROVIDER_SITE_OTHER): Payer: Self-pay | Admitting: Physician Assistant

## 2024-04-29 VITALS — BP 137/85 | HR 86

## 2024-04-29 DIAGNOSIS — H93293 Other abnormal auditory perceptions, bilateral: Secondary | ICD-10-CM

## 2024-04-29 DIAGNOSIS — H6593 Unspecified nonsuppurative otitis media, bilateral: Secondary | ICD-10-CM

## 2024-04-29 DIAGNOSIS — Z09 Encounter for follow-up examination after completed treatment for conditions other than malignant neoplasm: Secondary | ICD-10-CM

## 2024-04-29 DIAGNOSIS — Z0110 Encounter for examination of ears and hearing without abnormal findings: Secondary | ICD-10-CM | POA: Diagnosis not present

## 2024-04-29 DIAGNOSIS — Z8669 Personal history of other diseases of the nervous system and sense organs: Secondary | ICD-10-CM | POA: Diagnosis not present

## 2024-04-29 NOTE — Progress Notes (Signed)
  [address], [address] [place], [place] [address] [phone]  Audiological Evaluation    Name: DANAE OLAND     DOB:   Feb 24, 2001      MRN:   [id number]                                                                                     Service Date: 04/29/2024     Accompanied by: unaccompanied   Patient comes today after Lorane Rocker, PA-C sent a referral for a hearing evaluation due to concerns with ear fullness.   Symptoms Yes Details  Hearing loss  []    Tinnitus  [x]  Sometimes- both ears  Ear pain/ infections/pressure  [x]  Both ears - comes and goes  Balance problems  []    Noise exposure history  []    Previous ear surgeries  []    Family history of hearing loss  []    Amplification  []    Other  []        Tympanometry: Right ear: Type A- Normal external ear canal volume with normal middle ear pressure and tympanic membrane compliance. Left ear: Type A- Normal external ear canal volume with normal middle ear pressure and tympanic membrane compliance.    Pure tone Audiometry: Both ears- Normal hearing from [phone].   Speech Audiometry: Right ear- Speech Reception Threshold (SRT) was obtained at 15 dBHL. Left ear-Speech Reception Threshold (SRT) was obtained at 15 dBHL.   Word Recognition Score Tested using NU-6 (MLV) Right ear: 100% was obtained at a presentation level of 55 dBHL with contralateral masking which is deemed as  excellent. Left ear: 100% was obtained at a presentation level of 55 dBHL with contralateral masking which is deemed as  excellent.   The hearing test results were completed under headphones and results are deemed to be of good reliability. Test technique:  conventional      Recommendations: Follow up with ENT as scheduled for today. Return for a hearing evaluation if concerns with hearing changes arise or per MD recommendation.   Michole Lecuyer MARIE LEROUX-MARTINEZ, AUD

## 2024-04-29 NOTE — Progress Notes (Signed)
 Dear Dr. Paulene Boron, Here is my assessment for our mutual patient, Jacqueline Olson. Thank you for allowing me the opportunity to care for your patient. Please do not hesitate to contact me should you have any other questions. Sincerely, Belma Boxer PA-C  Otolaryngology Clinic Note Referring provider: Dr. Paulene Boron HPI:  Jacqueline Olson is a 23 y.o. female kindly referred by Dr. Paulene Boron   The patient is a 23 year old female seen in our office for follow-up evaluation of ear fullness.  She was last seen in the office on 02/08/2024.  Below is a recap of the encounter.  The patient is a 23 year old female seen in our office for evaluation of ear fullness.  She notes that in November she felt like both of her ears were filled with fluid.  She did have an upper respiratory infection at that time and was seen and evaluated and told she had fluid in the ears.  There is no antibiotic prescribed given this was viral in nature.  She notes things got better with time.  She notes that occasionally she will have a sharp pain in her right ear the last for several seconds and then goes away, she will have days where she has no pain whatsoever.  She notes that it feels like she needs to pop her ears but this does not help.  No associated clicking or popping, she notes occasionally some ringing that is not persistent.  As a child she had no significant ear issues or recurrent ear infections, no hearing issues, no trauma, no head or neck surgery.  She notes a history of seasonal allergies but she does not take her medication consistently.  She notes currently she has some nasal congestion from a cold she had last week.  She has been using Flonase for the last 2 weeks.  She does not smoke and does not use alcohol.  Currently she is unemployed.   Update 04/30/2024.  Since her last office visit she notes her symptoms have resolved, she notes intermittent pressure in the ears, none today.  She denies any hearing deficits, ringing, or any other  concerning signs or symptoms.  No upper respiratory infection.  She has been using the Flonase and daily antihistamine but discontinued as her symptoms improved.     Independent Review of Additional Tests or Records:  Audiological evaluation on 04/29/2024 Tympanometry: Right ear: Type A- Normal external ear canal volume with normal middle ear pressure and tympanic membrane compliance. Left ear: Type A- Normal external ear canal volume with normal middle ear pressure and tympanic membrane compliance.     Pure tone Audiometry: Both ears- Normal hearing from [phone] Hz.     Speech Audiometry: Right ear- Speech Reception Threshold (SRT) was obtained at 15 dBHL. Left ear-Speech Reception Threshold (SRT) was obtained at 15 dBHL.   Word Recognition Score Tested using NU-6 (MLV) Right ear: 100% was obtained at a presentation level of 55 dBHL with contralateral masking which is deemed as  excellent. Left ear: 100% was obtained at a presentation level of 55 dBHL with contralateral masking which is deemed as  excellent.   The hearing test results were completed under headphones and results are deemed to be of good reliability. Test technique:  conventional    Normal audiological evaluation  PMH/Meds/All/SocHx/FamHx/ROS:   Past Medical History:  Diagnosis Date   ACL tear 02/05/2016   right knee   Medial meniscus tear 02/05/2016   right knee   Migraines    Vasovagal syncope  Vitamin D deficiency      Past Surgical History:  Procedure Laterality Date   CENTRAL VENOUS CATHETER INSERTION  02/17/01   KNEE ARTHROSCOPY WITH ANTERIOR CRUCIATE LIGAMENT (ACL) REPAIR WITH HAMSTRING GRAFT Right 03/02/2016   Procedure: RIGHT KNEE ARTHROSCOPY,PARTIAL MEDIAL MENISECTOMY WITH ANTERIOR CRUCIATE LIGAMENT (ACL) REPAIR WITH HAMSTRING AUTOGRAFT;  Surgeon: Ferd Householder, MD;  Location: [facility];  Service: Orthopedics;  Laterality: Right;   KNEE ARTHROSCOPY WITH LATERAL MENISECTOMY  Right 03/02/2016   Procedure: KNEE ARTHROSCOPY WITH PARTIAL LATERAL MENISECTOMY;  Surgeon: Ferd Householder, MD;  Location:  S[facility];  Service: Orthopedics;  Laterality: Right;    Family History  Problem Relation Age of Onset   Hypertension Mother    Diabetes Father    Hypertension Father    Kidney disease Paternal Grandfather        ESRD/dialysis   Hypertension Paternal Grandfather    Diabetes Paternal Grandfather    Stroke Paternal Grandfather      Social Connections: Unknown (04/25/2022)   Received from [facility], [facility]   Social Network    Social Network: Not on file      Current Outpatient Medications:    cetirizine (ZYRTEC) 10 MG tablet, Take 10 mg by mouth daily., Disp: , Rfl:    estradiol-norethindrone (COMBIPATCH ) 0.05-0.25 MG/DAY, Place 1 patch onto the skin 2 (two) times a week., Disp: 8 patch, Rfl: 12   famotidine (PEPCID) 40 MG tablet, Take by mouth., Disp: , Rfl:    hyoscyamine (ANASPAZ) 0.125 MG TBDP disintergrating tablet, Take by mouth., Disp: , Rfl:    metFORMIN  (GLUCOPHAGE -XR) 500 MG 24 hr tablet, Take 1 tablet (500 mg total) by mouth daily with breakfast., Disp: 90 tablet, Rfl: 3   nortriptyline  (PAMELOR ) 25 MG capsule, 1 capsule Orally Once a day, Disp: , Rfl:    omeprazole (PRILOSEC) 40 MG capsule, Take by mouth., Disp: , Rfl:    Rimegepant Sulfate (NURTEC) 75 MG TBDP, Take 1 tablet (75 mg total) by mouth as needed., Disp: 8 tablet, Rfl: 11   Semaglutide ,0.25 or 0.5MG /DOS, 2 MG/3ML SOPN, Inject 0.5 mg into the skin once a week., Disp: 3 mL, Rfl: 11   topiramate  (TOPAMAX ) 50 MG tablet, Take 1 tablet (50 mg total) by mouth daily., Disp: 90 tablet, Rfl: 3   Vitamin D, Ergocalciferol, (DRISDOL) 1.25 MG (50000 UNIT) CAPS capsule, Take 50,000 Units by mouth once a week., Disp: , Rfl:    ibuprofen  (ADVIL ) 600 MG tablet, Take 1 tablet (600 mg total) by mouth every 6 (six) hours as needed for moderate pain. (Patient not taking: Reported on  04/29/2024), Disp: 30 tablet, Rfl: 0   Physical Exam:   BP 137/85   Pulse 86   SpO2 94%   Pertinent Findings  CN II-XII intact Bilateral EAC clear and TM intact with well pneumatized middle ear spaces Anterior rhinoscopy: Septum midline; bilateral inferior turbinates with no hypertrophy  No obviously neck masses/lymphadenopathy/thyromegaly No respiratory distress or stridor  Seprately Identifiable Procedures:  None  Impression & Plans:  Christeena Krogh is a 23 y.o. female with the following   Ear fullness-  23 year old female seen today for follow-up evaluation of bilateral ear fullness.  At her last visit I did note effusions, no effusions noted today.  Her audiogram shows bilateral type a tympanometry with normal hearing.  She is asymptomatic today.  If she has any recurrence of symptoms I have recommended using Flonase and antihistamines, she may follow-up in the office on  a as needed basis.  She verbalized understanding and agreement to today's plan.   - f/u PRN   Thank you for allowing me the opportunity to care for your patient. Please do not hesitate to contact me should you have any other questions.  Sincerely, Belma Boxer PA-C [facility] Phone: [phone] Fax: [phone]  04/29/2024, 11:08 AM

## 2024-05-06 ENCOUNTER — Encounter: Payer: Self-pay | Admitting: Audiology

## 2024-05-31 ENCOUNTER — Encounter: Payer: Self-pay | Admitting: Internal Medicine

## 2024-06-02 MED ORDER — NORETHINDRONE ACET-ETHINYL EST 1-20 MG-MCG PO TABS: 1.0000 | ORAL_TABLET | Freq: Every day | ORAL | 11 refills | Status: DC

## 2024-06-18 ENCOUNTER — Telehealth: Payer: Self-pay | Admitting: Internal Medicine

## 2024-06-18 NOTE — Telephone Encounter (Signed)
 Patient called this afternoon,stating her birth control RX is being denied by the pharmacy and isn't sure why.Patient wanted to make provider aware,her insurance has not changed and everything is up to date.Please advise,her contact info is [phone]

## 2024-06-19 NOTE — Telephone Encounter (Signed)
LDTVM and sent my chart message.

## 2024-06-24 ENCOUNTER — Telehealth: Payer: Self-pay | Admitting: Internal Medicine

## 2024-06-24 MED ORDER — NORETHINDRONE ACET-ETHINYL EST 1-20 MG-MCG PO TABS: 1.0000 | ORAL_TABLET | Freq: Every day | ORAL | 11 refills | Status: DC

## 2024-06-24 NOTE — Telephone Encounter (Signed)
 MEDICATION: norethindrone -ethinyl estradiol (LOESTRIN 1/20, 21,) 1-20 MG-MCG tablet   PHARMACY:    [facility] [phone] - [place], [place] - [address] (Ph: [phone])    HAS THE PATIENT CONTACTED THEIR PHARMACY?  Yes  IS THIS A 90 DAY SUPPLY : Yes  IS PATIENT OUT OF MEDICATION: No  IF NOT; HOW MUCH IS LEFT: Not sure  LAST APPOINTMENT DATE: @2 /05/2024  NEXT APPOINTMENT DATE:@8 /05/2024  DO WE HAVE YOUR PERMISSION TO LEAVE A DETAILED MESSAGE?:Yes  OTHER COMMENTS: Patient states that the pharmacy has told her that they do not have a prescription on file with her.   **Let patient know to contact pharmacy at the end of the day to make sure medication is ready. **  ** Please notify patient to allow 48-72 hours to process**  **Encourage patient to contact the pharmacy for refills or they can request refills through [facility]**

## 2024-06-24 NOTE — Telephone Encounter (Signed)
 MEDICATION: norethindrone -ethinyl estradiol (LOESTRIN 1/20, 21,) 1-20 MG-MCG tablet   PHARMACY:    [facility] [phone] - [place], [place] - [address] (Ph: [phone])    HAS THE PATIENT CONTACTED THEIR PHARMACY?  Yes  IS THIS A 90 DAY SUPPLY : Yes  IS PATIENT OUT OF MEDICATION: No  IF NOT; HOW MUCH IS LEFT: Not sure  LAST APPOINTMENT DATE: @7 /08/2024  NEXT APPOINTMENT DATE:@8 /05/2024  DO WE HAVE YOUR PERMISSION TO LEAVE A DETAILED MESSAGE?:  OTHER COMMENTS:    **Let patient know to contact pharmacy at the end of the day to make sure medication is ready. **  ** Please notify patient to allow 48-72 hours to process**  **Encourage patient to contact the pharmacy for refills or they can request refills through [facility]**

## 2024-06-24 NOTE — Telephone Encounter (Signed)
 Done

## 2024-07-16 ENCOUNTER — Ambulatory Visit: Payer: No Typology Code available for payment source | Admitting: Internal Medicine

## 2024-07-18 ENCOUNTER — Encounter: Payer: Self-pay | Admitting: Internal Medicine

## 2024-07-18 ENCOUNTER — Telehealth: Payer: Self-pay

## 2024-07-18 ENCOUNTER — Ambulatory Visit (INDEPENDENT_AMBULATORY_CARE_PROVIDER_SITE_OTHER): Admitting: Internal Medicine

## 2024-07-18 VITALS — BP 124/70 | HR 90 | Ht 61.0 in | Wt 243.0 lb

## 2024-07-18 DIAGNOSIS — E785 Hyperlipidemia, unspecified: Secondary | ICD-10-CM

## 2024-07-18 DIAGNOSIS — Z7985 Long-term (current) use of injectable non-insulin antidiabetic drugs: Secondary | ICD-10-CM

## 2024-07-18 DIAGNOSIS — E119 Type 2 diabetes mellitus without complications: Secondary | ICD-10-CM

## 2024-07-18 DIAGNOSIS — Q991 46, XX true hermaphrodite: Secondary | ICD-10-CM | POA: Diagnosis not present

## 2024-07-18 LAB — POCT GLUCOSE (DEVICE FOR HOME USE): Glucose Fasting, POC: 83 mg/dL (ref 70–99)

## 2024-07-18 LAB — POCT GLYCOSYLATED HEMOGLOBIN (HGB A1C): Hemoglobin A1C: 5.7 % — AB (ref 4.0–5.6)

## 2024-07-18 MED ORDER — SEMAGLUTIDE (1 MG/DOSE) 4 MG/3ML ~~LOC~~ SOPN
1.0000 mg | PEN_INJECTOR | SUBCUTANEOUS | 3 refills | Status: AC
Start: 2024-07-18 — End: ?

## 2024-07-18 MED ORDER — NORETHINDRONE ACET-ETHINYL EST 1-20 MG-MCG PO TABS: 1.0000 | ORAL_TABLET | Freq: Every day | ORAL | 11 refills | Status: DC

## 2024-07-18 MED ORDER — NORETHINDRONE ACET-ETHINYL EST 1-20 MG-MCG PO TABS: 1.0000 | ORAL_TABLET | Freq: Every day | ORAL | 3 refills | Status: AC

## 2024-07-18 NOTE — Telephone Encounter (Signed)
 Norethindrone  prescription written for 28 tabs but pack comes with 21

## 2024-07-18 NOTE — Progress Notes (Signed)
 Name: Jacqueline Olson  MRN/ DOB: [id number], March 06, 2001    Age/ Sex: 23 y.o., female     PCP: Sun, Vyvyan, MD   Reason for Endocrinology Evaluation: Premature Ovarian Failure     Initial Endocrinology Clinic Visit: 11/17/2019    PATIENT IDENTIFIER: Jacqueline Olson is a 23 y.o., female with a past medical history of Prediabetes, obesity and premature ovarian failure  . She has followed with [facility] since 11/17/2019 for consultative assistance with management of her Premature ovarian failure.   HISTORICAL SUMMARY: The patient was first diagnosed with premature ovarian failure during evaluation of irregular menses in 04/2019 with an FSH of 58 mIU/mL and LH 34.3 mIU/mL  With an estradiol of 11.2 pg/mL. She was started on Combined OCP . 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   Per pt she had menarche at age 42. Her menstruations have been regular until the age of 47 when her periods have become irregular.  She was also started on metformin  for pre-diabetes  Graduated cosmetology school  Father with diabetes.  Maternal aunt with thyroid  disease.    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 No evidence of Turner syndrome and it was determined no cardiology referral is needed and this deletion only causes premature ovarian failure .  No Hx of premature ovarian failure in the family   Switch from oral COC's to a CombiPatch  09/2023 as it was not adhering to her skin   DIABETES HISTORY :  She was initially diagnosed with prediabetes in 2019, but her A1c increased to 7.1% in 09/2022. She was on metformin   Attempted to prescribe Ozempic  09/2023 but her insurance declined, requiring step therapy and was restarted on metformin  but developed GI side effects and had to switch back to Ozempic  in 2025    Sister with clotting disorders/PE  , has  lupus   SUBJECTIVE:   During the last visit (10/02/2023):  Today (07/18/24): Jacqueline Olson is here for follow-up on diabetes and premature ovarian failure management.  She checks her blood sugars occasionally.  Patient has been noted with 7 LB weight loss since her last visit here Continues with changes in bowel movements Has occasional  nausea  Continues with  rare hot flashes during the hot  Deniea acne  Denies hirsutism  Denies spotting     HOME ENDOCRINE REGIMEN:  Ozempic  0.5 mg weekly Loestrin 1/20 mg daily    Statin: no ACE-I/ARB: no   METER DOWNLOAD SUMMARY:    DIABETIC COMPLICATIONS: Microvascular complications:   Denies: CKD, retinopthy Last Eye Exam: Completed   Macrovascular complications:   Denies: CAD, CVA, PVD      HISTORY:  Past Medical History:  Past Medical History:  Diagnosis Date   ACL tear 02/05/2016   right knee   Medial meniscus tear 02/05/2016   right knee   Migraines    Vasovagal syncope    Vitamin D deficiency    Past Surgical History:  Past Surgical History:  Procedure Laterality Date   CENTRAL VENOUS CATHETER INSERTION  01-20-01   KNEE ARTHROSCOPY WITH ANTERIOR CRUCIATE LIGAMENT (ACL) REPAIR WITH HAMSTRING GRAFT Right 03/02/2016   Procedure: RIGHT KNEE ARTHROSCOPY,PARTIAL MEDIAL MENISECTOMY WITH ANTERIOR CRUCIATE LIGAMENT (ACL) REPAIR WITH HAMSTRING AUTOGRAFT;  Surgeon: Toribio JULIANNA Chancy, MD;  Location: [facility];  Service: Orthopedics;  Laterality: Right;   KNEE  ARTHROSCOPY WITH LATERAL MENISECTOMY Right 03/02/2016   Procedure: KNEE ARTHROSCOPY WITH PARTIAL LATERAL MENISECTOMY;  Surgeon: Toribio JULIANNA Chancy, MD;  Location: [facility];  Service: Orthopedics;  Laterality: Right;   Social History:  reports that she has never smoked. She has never used smokeless tobacco. She reports that she does not drink alcohol and does not use drugs. Family History:  Family History  Problem Relation Age of  Onset   Hypertension Mother    Diabetes Father    Hypertension Father    Kidney disease Paternal Grandfather        ESRD/dialysis   Hypertension Paternal Grandfather    Diabetes Paternal Grandfather    Stroke Paternal Grandfather      HOME MEDICATIONS: Allergies as of 07/18/2024   No Known Allergies      Medication List        Accurate as of July 18, 2024  8:03 AM. If you have any questions, ask your nurse or doctor.          STOP taking these medications    metFORMIN  500 MG 24 hr tablet Commonly known as: GLUCOPHAGE -XR Stopped by: Donell PARAS Savahna Casados       TAKE these medications    cetirizine 10 MG tablet Commonly known as: ZYRTEC Take 10 mg by mouth daily.   famotidine 40 MG tablet Commonly known as: PEPCID Take by mouth.   hyoscyamine 0.125 MG Tbdp disintergrating tablet Commonly known as: ANASPAZ Take by mouth.   ibuprofen  600 MG tablet Commonly known as: ADVIL  Take 1 tablet (600 mg total) by mouth every 6 (six) hours as needed for moderate pain.   norethindrone -ethinyl estradiol 1-20 MG-MCG tablet Commonly known as: Loestrin 1/20 (21) Take 1 tablet by mouth daily.   nortriptyline  25 MG capsule Commonly known as: PAMELOR  1 capsule Orally Once a day   Nurtec 75 MG Tbdp Generic drug: Rimegepant Sulfate Take 1 tablet (75 mg total) by mouth as needed.   omeprazole 40 MG capsule Commonly known as: PRILOSEC Take by mouth.   Semaglutide (0.25 or 0.5MG /DOS) 2 MG/3ML Sopn Inject 0.5 mg into the skin once a week.   topiramate  50 MG tablet Commonly known as: Topamax  Take 1 tablet (50 mg total) by mouth daily.   Vitamin D (Ergocalciferol) 1.25 MG (50000 UNIT) Caps capsule Commonly known as: DRISDOL Take 50,000 Units by mouth once a week.          OBJECTIVE:   PHYSICAL EXAM: VS: BP 124/70 (BP Location: Left Arm, Patient Position: Sitting, Cuff Size: Normal)   Pulse 90   Ht 5' 1 (1.549 m)   Wt 243 lb (110.2 kg)   SpO2 99%   BMI  45.91 kg/m     Filed Weights   07/18/24 0756  Weight: 243 lb (110.2 kg)     EXAM: General: Pt appears well and is in NAD  Lungs: Clear with good BS bilat   Heart: Auscultation: RRR.  Abdomen: Normoactive bowel sounds, soft, nontender, without masses or organomegaly palpable  Extremities:  BL LE: No pretibial edema  Mental Status: Judgment, insight: Intact Orientation: Oriented to time, place, and person Mood and affect: No depression, anxiety, or agitation   DM Foot Exam 01/17/2024  The skin of the feet is intact without sores or ulcerations. The pedal pulses are 2+ on right and 2+ on left. The sensation is intact to a screening 5.07, 10 gram monofilament bilaterally   DATA REVIEWED:  Latest Reference Range & Units 07/18/24 08:20  Comprehensive metabolic panel with GFR  Rpt  Sodium 135 - 146 mmol/L 140  Potassium 3.5 - 5.3 mmol/L 4.6  Chloride 98 - 110 mmol/L 106  CO2 20 - 32 mmol/L 28  Glucose 65 - 99 mg/dL 92  BUN 7 - 25 mg/dL 13  Creatinine 9.49 - 9.03 mg/dL 9.27  Calcium 8.6 - 89.7 mg/dL 9.3  BUN/Creatinine Ratio 6 - 22 (calc) SEE NOTE:  eGFR > OR = 60 mL/min/1.24m2 120  AG Ratio 1.0 - 2.5 (calc) 1.5  AST 10 - 30 U/L 10  ALT 6 - 29 U/L 12  Total Protein 6.1 - 8.1 g/dL 6.7  Total Bilirubin 0.2 - 1.2 mg/dL 0.3  Total CHOL/HDL Ratio <5.0 (calc) 3.7  Cholesterol <200 mg/dL 798 (H)  HDL Cholesterol > OR = 50 mg/dL 54  LDL Cholesterol (Calc) mg/dL (calc) 868 (H)  MICROALB/CREAT RATIO <30 mg/g creat 6  Non-HDL Cholesterol (Calc) <130 mg/dL (calc) 852 (H)  Triglycerides <150 mg/dL 72    Latest Reference Range & Units 07/18/24 08:20  Microalb, Ur mg/dL 1.3  MICROALB/CREAT RATIO <30 mg/g creat 6  Creatinine, Urine 20 - 275 mg/dL 777    Thyroid  ultrasound 03/27/2022  Normal sized and appearing thyroid  without discrete nodule or mass, similar to the 09/2020 examination.     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 Deletions of the X long arm have correlated with premature menopause or gonadal dysgenesis.  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 No evidence of Turner syndrome and it was determined no cardiology referral is needed and this deletion only causes premature ovarian failure . - Due to imperfect adherence to COC's, I had switched her to the CombiPatch , but these were not adhering to her skin properly, and we switched back to oral COC's  Medications : Loestrin 1/20 mg daily   2) Type 2 Diabetes Mellitus, Optimally controlled, Without complications - Most recent A1c of 5.7 %. Goal A1c < 7.0 %.    -A1c has trended down from 6.5% to 5.7% - Intolerant to metformin  - Will increase Ozempic  - Bmp and MA/Cr ratio normal    MEDICATIONS: Stop Ozempic  0.5 mg weekly Start Ozempic  1 mg weekly  EDUCATION / INSTRUCTIONS: BG monitoring instructions: Patient is instructed to check her blood sugars 2-3 times a week. Call [facility] if: BG persistently < 70  I reviewed the Rule of 15 for the treatment of hypoglycemia in detail with the patient. Literature supplied.    2) Diabetic complications:  Eye: Does not have known diabetic retinopathy.  Neuro/ Feet: Does not have known diabetic peripheral neuropathy .  Renal: Patient does not have known baseline CKD. She   is not on an ACEI/ARB at present.    3) Dyslipidemia:  - LDL elevated  - Will encourage low fat diet     F/U in 6 months   Signed electronically by: Stefano Redgie Butts, MD  [facility]  [facility] [address] [place] [place], [place] [address] Phone: [phone] FAX: [phone]  CC: Sun, Vyvyan, MD [phone] [place] [address] Phone: [phone]  Fax: [phone]   Return to Endocrinology clinic as below: Future Appointments  Date Time Provider Department Center  10/16/2024  1:30 PM Cary No, NP [facility] None

## 2024-07-18 NOTE — Patient Instructions (Signed)
Increase Ozempic 1 mg weekly  

## 2024-07-19 LAB — COMPREHENSIVE METABOLIC PANEL WITH GFR
AG Ratio: 1.5 (calc) (ref 1.0–2.5)
ALT: 12 U/L (ref 6–29)
AST: 10 U/L (ref 10–30)
Albumin: 4 g/dL (ref 3.6–5.1)
Alkaline phosphatase (APISO): 74 U/L (ref 31–125)
BUN: 13 mg/dL (ref 7–25)
CO2: 28 mmol/L (ref 20–32)
Calcium: 9.3 mg/dL (ref 8.6–10.2)
Chloride: 106 mmol/L (ref 98–110)
Creat: 0.72 mg/dL (ref 0.50–0.96)
Globulin: 2.7 g/dL (ref 1.9–3.7)
Glucose, Bld: 92 mg/dL (ref 65–99)
Potassium: 4.6 mmol/L (ref 3.5–5.3)
Sodium: 140 mmol/L (ref 135–146)
Total Bilirubin: 0.3 mg/dL (ref 0.2–1.2)
Total Protein: 6.7 g/dL (ref 6.1–8.1)
eGFR: 120 mL/min/1.73m2 (ref >=60)

## 2024-07-19 LAB — MICROALBUMIN / CREATININE URINE RATIO
Creatinine, Urine: 222 mg/dL (ref 20–275)
Microalb Creat Ratio: 6 mg/g{creat} (ref <30)
Microalb, Ur: 1.3 mg/dL

## 2024-07-19 LAB — LIPID PANEL
Cholesterol: 201 mg/dL — ABNORMAL HIGH (ref <200)
HDL: 54 mg/dL (ref >=50)
LDL Cholesterol (Calc): 131 mg/dL — ABNORMAL HIGH
Non-HDL Cholesterol (Calc): 147 mg/dL — ABNORMAL HIGH (ref <130)
Total CHOL/HDL Ratio: 3.7 (calc) (ref <5.0)
Triglycerides: 72 mg/dL (ref <150)

## 2024-07-21 ENCOUNTER — Ambulatory Visit: Payer: Self-pay | Admitting: Internal Medicine

## 2024-07-21 DIAGNOSIS — E785 Hyperlipidemia, unspecified: Secondary | ICD-10-CM | POA: Insufficient documentation

## 2024-08-02 ENCOUNTER — Encounter: Payer: Self-pay | Admitting: Internal Medicine

## 2024-10-06 ENCOUNTER — Telehealth: Payer: Self-pay | Admitting: Family Medicine

## 2024-10-06 NOTE — Telephone Encounter (Signed)
 Patient reschedule appointment due to work schedule conflict. Reschedule appointment with Amy, NP on 10/13/24 at 9:30 am a MyChart Visit approved by Greig, NP

## 2024-10-08 NOTE — Patient Instructions (Signed)
 Below is our plan:  We will continue to monitor. Continue Tylenol  as needed for abortive therapy but do not take more than 1-2 doses per week.   Please make sure you are staying well hydrated. I recommend 50-60 ounces daily. Well balanced diet and regular exercise encouraged. Consistent sleep schedule with 6-8 hours recommended.   Please continue follow up with care team as directed.   Follow up with me as needed  You may receive a survey regarding today's visit. I encourage you to leave honest feed back as I do use this information to improve patient care. Thank you for seeing me today!   GENERAL HEADACHE INFORMATION:   Natural supplements: Magnesium Oxide or Magnesium Glycinate 500 mg at bed (up to 800 mg daily) Coenzyme Q10 300 mg in AM Vitamin B2- 200 mg twice a day   Add 1 supplement at a time since even natural supplements can have undesirable side effects. You can sometimes buy supplements cheaper (especially Coenzyme Q10) at www.webmailguide.co.za or at [facility].  Migraine with aura: There is increased risk for stroke in women with migraine with aura and a contraindication for the combined contraceptive pill for use by women who have migraine with aura. The risk for women with migraine without aura is lower. However other risk factors like smoking are far more likely to increase stroke risk than migraine. There is a recommendation for no smoking and for the use of OCPs without estrogen such as progestogen only pills particularly for women with migraine with aura.SABRA People who have migraine headaches with auras may be 3 times more likely to have a stroke caused by a blood clot, compared to migraine patients who don't see auras. Women who take hormone-replacement therapy may be 30 percent more likely to suffer a clot-based stroke than women not taking medication containing estrogen. Other risk factors like smoking and high blood pressure may be  much more important.    Vitamins and herbs that show  potential:   Magnesium: Magnesium (250 mg twice a day or 500 mg at bed) has a relaxant effect on smooth muscles such as blood vessels. Individuals suffering from frequent or daily headache usually have low magnesium levels which can be increase with daily supplementation of 400-750 mg. Three trials found 40-90% average headache reduction  when used as a preventative. Magnesium may help with headaches are aura, the best evidence for magnesium is for migraine with aura is its thought to stop the cortical spreading depression we believe is the pathophysiology of migraine aura.Magnesium also demonstrated the benefit in menstrually related migraine.  Magnesium is part of the messenger system in the serotonin cascade and it is a good muscle relaxant.  It is also useful for constipation which can be a side effect of other medications used to treat migraine. Good sources include nuts, whole grains, and tomatoes. Side Effects: loose stool/diarrhea  Riboflavin (vitamin B 2) 200 mg twice a day. This vitamin assists nerve cells in the production of ATP a principal energy storing molecule.  It is necessary for many chemical reactions in the body.  There have been at least 3 clinical trials of riboflavin using 400 mg per day all of which suggested that migraine frequency can be decreased.  All 3 trials showed significant improvement in over half of migraine sufferers.  The supplement is found in bread, cereal, milk, meat, and poultry.  Most Americans get more riboflavin than the recommended daily allowance, however riboflavin deficiency is not necessary for the supplements to  help prevent headache. Side effects: energizing, green urine   Coenzyme Q10: This is present in almost all cells in the body and is critical component for the conversion of energy.  Recent studies have shown that a nutritional supplement of CoQ10 can reduce the frequency of migraine attacks by improving the energy production of cells as with  riboflavin.  Doses of 150 mg twice a day have been shown to be effective.   Melatonin: Increasing evidence shows correlation between melatonin secretion and headache conditions.  Melatonin supplementation has decreased headache intensity and duration.  It is widely used as a sleep aid.  Sleep is natures way of dealing with migraine.  A dose of 3 mg is recommended to start for headaches including cluster headache. Higher doses up to 15 mg has been reviewed for use in Cluster headache and have been used. The rationale behind using melatonin for cluster is that many theories regarding the cause of Cluster headache center around the disruption of the normal circadian rhythm in the brain.  This helps restore the normal circadian rhythm.   HEADACHE DIET: Foods and beverages which may trigger migraine Note that only 20% of headache patients are food sensitive. You will know if you are food sensitive if you get a headache consistently 20 minutes to 2 hours after eating a certain food. Only cut out a food if it causes headaches, otherwise you might remove foods you enjoy! What matters most for diet is to eat a well balanced healthy diet full of vegetables and low fat protein, and to not miss meals.   Chocolate, other sweets ALL cheeses except cottage and cream cheese Dairy products, yogurt, sour cream, ice cream Liver Meat extracts (Bovril, Marmite, meat tenderizers) Meats or fish which have undergone aging, fermenting, pickling or smoking. These include: Hotdogs,salami,Lox,sausage, mortadellas,smoked salmon, pepperoni, Pickled herring Pods of broad bean (English beans, Chinese pea pods, Italian (fava) beans, lima and navy beans Ripe avocado, ripe banana Yeast extracts or active yeast preparations such as Brewer's or Fleishman's (commercial bakes goods are permitted) Tomato based foods, pizza (lasagna, etc.)   MSG (monosodium glutamate) is disguised as many things; look for these common  aliases: Monopotassium glutamate Autolysed yeast Hydrolysed protein Sodium caseinate "flavorings" "all natural preservatives Nutrasweet   Avoid all other foods that convincingly provoke headaches.   Resources: The Dizzy Bluford Aid Your Headache Diet, migrainestrong.com  https://zamora-andrews.com/   Caffeine and Migraine For patients that have migraine, caffeine intake more than 3 days per week can lead to dependency and increased migraine frequency. I would recommend cutting back on your caffeine intake as best you can. The recommended amount of caffeine is 200-300 mg daily, although migraine patients may experience dependency at even lower doses. While you may notice an increase in headache temporarily, cutting back will be helpful for headaches in the long run. For more information on caffeine and migraine, visit: https://americanmigrainefoundation.org/resource-library/caffeine-and-migraine/   Headache Prevention Strategies:   1. Maintain a headache diary; learn to identify and avoid triggers.  - This can be a simple note where you log when you had a headache, associated symptoms, and medications used - There are several smartphone apps developed to help track migraines: Migraine Buddy, Migraine Monitor, Curelator N1-Headache App   Common triggers include: Emotional triggers: Emotional/Upset family or friends Emotional/Upset occupation Business reversal/success Anticipation anxiety Crisis-serious Post-crisis periodNew job/position   Physical triggers: Vacation Day Weekend Strenuous Exercise High Altitude Location New Move Menstrual Day Physical Illness Oversleep/Not enough sleep Weather changes Light: Photophobia or  light sesnitivity treatment involves a balance between desensitization and reduction in overly strong input. Use dark polarized glasses outside, but not inside. Avoid bright or fluorescent light, but do not dim  environment to the point that going into a normally lit room hurts. Consider FL-41 tint lenses, which reduce the most irritating wavelengths without blocking too much light.  These can be obtained at axonoptics.com or theraspecs.com Foods: see list above.   2. Limit use of acute treatments (over-the-counter medications, triptans, etc.) to no more than 2 days per week or 10 days per month to prevent medication overuse headache (rebound headache).     3. Follow a regular schedule (including weekends and holidays): Don't skip meals. Eat a balanced diet. 8 hours of sleep nightly. Minimize stress. Exercise 30 minutes per day. Being overweight is associated with a 5 times increased risk of chronic migraine. Keep well hydrated and drink 6-8 glasses of water per day.   4. Initiate non-pharmacologic measures at the earliest onset of your headache. Rest and quiet environment. Relax and reduce stress. Breathe2Relax is a free app that can instruct you on    some simple relaxtion and breathing techniques. Http://Dawnbuse.com is a    free website that provides teaching videos on relaxation.  Also, there are  many apps that   can be downloaded for "mindful" relaxation.  An app called YOGA NIDRA will help walk you through mindfulness. Another app called Calm can be downloaded to give you a structured mindfulness guide with daily reminders and skill development. Headspace for guided meditation Mindfulness Based Stress Reduction Online Course: www.palousemindfulness.com Cold compresses.   5. Don't wait!! Take the maximum allowable dosage of prescribed medication at the first sign of migraine.   6. Compliance:  Take prescribed medication regularly as directed and at the first sign of a migraine.   7. Communicate:  Call your physician when problems arise, especially if your headaches change, increase in frequency/severity, or become associated with neurological symptoms (weakness, numbness, slurred speech, etc.).  Proceed to emergency room if you experience new or worsening symptoms or symptoms do not resolve, if you have new neurologic symptoms or if headache is severe, or for any concerning symptom.   8. Headache/pain management therapies: Consider various complementary methods, including medication, behavioral therapy, psychological counselling, biofeedback, massage therapy, acupuncture, dry needling, and other modalities.  Such measures may reduce the need for medications. Counseling for pain management, where patients learn to function and ignore/minimize their pain, seems to work very well.   9. Recommend changing family's attention and focus away from patient's headaches. Instead, emphasize daily activities. If first question of day is 'How are your headaches/Do you have a headache today?', then patient will constantly think about headaches, thus making them worse. Goal is to re-direct attention away from headaches, toward daily activities and other distractions.   10. Helpful Websites: www.AmericanHeadacheSociety.org patenthood.ch www.headaches.org tightmarket.nl www.achenet.org

## 2024-10-08 NOTE — Progress Notes (Signed)
 PATIENT: Jacqueline Olson DOB: Feb 05, 2001  REASON FOR VISIT: follow up HISTORY FROM: patient  Virtual Visit via MyChart video  I connected with Jacqueline Olson on 10/13/2024 at  9:30 AM EST via MyChart video and verified that I am speaking with the correct person using two identifiers.   I discussed the limitations, risks, security and privacy concerns of performing an evaluation and management service by Mychart video and the availability of in person appointments. I also discussed with the patient that there may be a patient responsible charge related to this service. The patient expressed understanding and agreed to proceed.   History of Present Illness:  10/13/2024 ALL (MyChart): Jacqueline Olson is a 23 y.o. female here today for follow up for migraines. She was last seen 10/2023. I advised she take topiramate  consistently and continue Nurtec. Since, she reports stopping topiramate  at least 3 months ago. Headaches have remained well controlled. She did not use Nurtec. Tylenol  works well for abortive therapy. May have 1-2 headaches per month.   10/17/23 ALL:  Jacqueline Olson returns for follow up for headaches. She was last seen 04/2023 and encouraged to start nortriptyline  as previously recommended by Dr Rush. Reportedly had difficulty remembering titration schedule with topiramate . She called 06/2023 asking to switch back to topiramate  as she did not like that nortriptyline  was an antidepressant. We started 50mg  at bedtime. She called 09/2023 reporting continued headaches and dose was increased to 50mg  BID. Since, she reports continuing topiramate  50mg  daily. She has not been consistent with taking medication. She may take it for several days then miss 2-3 days. She does report headaches seem better. She continues to have headaches most days but they are not as severe. Nurtec works well. She is on birth control.    04/16/2023 ALL:  Jacqueline Olson is a 23 y.o. female here today for follow up for  headaches. She was seen in consult with Dr Rush 01/2022. She was started on topiramate  but stopped after about 3 weeks as she couldn't remember titration schedule. Rizatriptan  started for abortive therapy but caused chest and throat tightness. She was restarted on topiramate  04/2022 and naratriptan  used for abortive therapy. PT referral placed for neck pain. At last visit 09/2022, she had stopped topiramate  and felt naratriptan  caused throat tightness. She was started on nortriptyline  25mg  QHS and Nurtec PRN. Since, she reports not knowing nortriptyline  was called in. She did start Nurtec and felt it was effective. She continues to have daily headaches. She describes 4-5 as being migrainous per month. She is taking Tylenol  or ibuprofen  every day.    She did not attend PT for neck pain. She reports cancelling the appt. She has been referred back to PT for chest pains and plans to start therapy tomorrow.    Sleep study with [facility] was normal in 2022.    HISTORY (copied from Dr Merna previous note)   23 year old female who follows in clinic for migraines. Brain MRI 02/27/22 was unremarkable.   At her list visit she was started on naratriptan  for migraine rescue. Topamax  was restarted for prevention. She was referred to neck PT.   Interval History: Headaches are no longer daily, but she continues to have them ~3 days per week. Topamax  was not very helpful and she had trouble keeping track of it so she stopped taking it. Naratriptan  helps take the edge off but causes chest tightness.      Headache days per month: 12 Headache free days  per month: 18   Current Headache Regimen: Preventative: none Abortive: naratriptan  2.5 mg PRN     Prior Therapies                                  Ibuprofen  Topamax  100 mg QHS Maxalt  10 mg PRN - chest and throat tightness Naratriptan  2.5 mg PRN - chest and throat tightness   Observations/Objective:  Generalized: Well developed, in no acute distress   Mentation: Alert oriented to time, place, history taking. Follows all commands speech and language fluent   Assessment and Plan:  23 y.o. year old female  has a past medical history of ACL tear (02/05/2016), Medial meniscus tear (02/05/2016), Migraines, Vasovagal syncope, and Vitamin D deficiency. here with  No diagnosis found.  Jacqueline Olson reports headaches have improved. She discontinued topiramate  about 3 months ago. We will continue to monitor for now. She will continue Tylenol  as needed. Healthy lifestyle habits encouraged. She will follow up with PCP as directed. She will return to see me as needed. She verbalizes understanding and agreement with this plan.   No orders of the defined types were placed in this encounter.   No orders of the defined types were placed in this encounter.    Follow Up Instructions:  I discussed the assessment and treatment plan with the patient. The patient was provided an opportunity to ask questions and all were answered. The patient agreed with the plan and demonstrated an understanding of the instructions.   The patient was advised to call back or seek an in-person evaluation if the symptoms worsen or if the condition fails to improve as anticipated.  I provided 15 minutes of face-to-face and non face-to-face time during this MyChart video encounter. Patient located at their place of residence. Provider is in the office.    Rochele Lueck, NP

## 2024-10-13 ENCOUNTER — Telehealth (INDEPENDENT_AMBULATORY_CARE_PROVIDER_SITE_OTHER): Admitting: Family Medicine

## 2024-10-13 ENCOUNTER — Encounter: Payer: Self-pay | Admitting: Family Medicine

## 2024-10-13 DIAGNOSIS — G43009 Migraine without aura, not intractable, without status migrainosus: Secondary | ICD-10-CM

## 2024-10-16 ENCOUNTER — Ambulatory Visit: Payer: No Typology Code available for payment source | Admitting: Family Medicine

## 2024-11-03 ENCOUNTER — Other Ambulatory Visit: Payer: Self-pay | Admitting: *Deleted

## 2024-11-03 NOTE — Telephone Encounter (Signed)
 Last seen on 10/13/24 per note  She discontinued topiramate  about 3 months ago  No follow up scheduled

## 2025-01-07 ENCOUNTER — Encounter: Payer: Self-pay | Admitting: Family Medicine

## 2025-01-08 ENCOUNTER — Other Ambulatory Visit: Payer: Self-pay

## 2025-01-08 MED ORDER — TOPIRAMATE 50 MG PO TABS: 50.0000 mg | ORAL_TABLET | Freq: Every day | ORAL | 1 refills | Status: AC

## 2025-01-16 ENCOUNTER — Ambulatory Visit: Admitting: Internal Medicine

## 2025-03-10 ENCOUNTER — Ambulatory Visit: Admitting: Internal Medicine

## 2025-03-24 ENCOUNTER — Ambulatory Visit: Admitting: Internal Medicine
